# Patient Record
Sex: Female | Born: 1940 | Race: White | Hispanic: No | Smoking: Never smoker
Health system: Southern US, Community
[De-identification: ages and names within clinical notes are randomized; demographics above are authoritative.]

## PROBLEM LIST (undated history)

## (undated) DIAGNOSIS — F32A Depression, unspecified: Secondary | ICD-10-CM

## (undated) DIAGNOSIS — K219 Gastro-esophageal reflux disease without esophagitis: Secondary | ICD-10-CM

## (undated) DIAGNOSIS — F329 Major depressive disorder, single episode, unspecified: Secondary | ICD-10-CM

## (undated) DIAGNOSIS — E119 Type 2 diabetes mellitus without complications: Secondary | ICD-10-CM

## (undated) DIAGNOSIS — M549 Dorsalgia, unspecified: Secondary | ICD-10-CM

## (undated) DIAGNOSIS — E079 Disorder of thyroid, unspecified: Secondary | ICD-10-CM

## (undated) DIAGNOSIS — K5900 Constipation, unspecified: Secondary | ICD-10-CM

## (undated) DIAGNOSIS — F419 Anxiety disorder, unspecified: Secondary | ICD-10-CM

## (undated) HISTORY — DX: Gastro-esophageal reflux disease without esophagitis: K21.9

## (undated) HISTORY — DX: Type 2 diabetes mellitus without complications: E11.9

## (undated) HISTORY — PX: ABDOMINAL HYSTERECTOMY: SHX81

## (undated) HISTORY — DX: Dorsalgia, unspecified: M54.9

## (undated) HISTORY — DX: Constipation, unspecified: K59.00

## (undated) HISTORY — PX: OTHER SURGICAL HISTORY: SHX169

## (undated) HISTORY — DX: Disorder of thyroid, unspecified: E07.9

---

## 2007-06-28 ENCOUNTER — Ambulatory Visit (HOSPITAL_COMMUNITY): Payer: Self-pay | Admitting: Psychiatry

## 2007-08-16 ENCOUNTER — Ambulatory Visit (HOSPITAL_COMMUNITY): Payer: Self-pay | Admitting: Psychiatry

## 2007-08-28 ENCOUNTER — Ambulatory Visit (HOSPITAL_COMMUNITY): Payer: Self-pay | Admitting: Psychiatry

## 2007-09-11 ENCOUNTER — Ambulatory Visit (HOSPITAL_COMMUNITY): Payer: Self-pay | Admitting: Psychiatry

## 2010-10-19 ENCOUNTER — Ambulatory Visit (INDEPENDENT_AMBULATORY_CARE_PROVIDER_SITE_OTHER): Payer: Self-pay | Admitting: Internal Medicine

## 2014-02-13 ENCOUNTER — Ambulatory Visit (INDEPENDENT_AMBULATORY_CARE_PROVIDER_SITE_OTHER): Payer: Medicare Other | Admitting: Psychiatry

## 2014-02-13 ENCOUNTER — Encounter (HOSPITAL_COMMUNITY): Payer: Self-pay | Admitting: Psychiatry

## 2014-02-13 VITALS — BP 120/80 | Ht 64.0 in | Wt 175.0 lb

## 2014-02-13 DIAGNOSIS — F329 Major depressive disorder, single episode, unspecified: Secondary | ICD-10-CM

## 2014-02-13 DIAGNOSIS — F332 Major depressive disorder, recurrent severe without psychotic features: Secondary | ICD-10-CM

## 2014-02-13 MED ORDER — MIRTAZAPINE 30 MG PO TABS: 30.0000 mg | ORAL_TABLET | Freq: Every day | ORAL | Status: DC

## 2014-02-13 MED ORDER — MIRTAZAPINE 30 MG PO TABS
30.0000 mg | ORAL_TABLET | Freq: Every day | ORAL | Status: DC
Start: 2014-02-13 — End: 2014-02-13

## 2014-02-13 NOTE — Progress Notes (Signed)
Psychiatric Assessment Adult  Patient Identification:  Teresa Mccullough Date of Evaluation:  02/13/2014 Chief Complaint: I'm depressed and I don't have any energy History of Chief Complaint:   Chief Complaint  Patient presents with  . Anxiety  . Depression  . Establish Care    Anxiety Symptoms include nervous/anxious behavior.     this patient is a 73 year old widowed white female who lives alone in [place]. Her husband died in 02/04/2007. 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  The patient was referred by Dr[facility] Dimas [facility]as [facility] for treatment of depression.  The patient states that she's had anxiety since her children were born in her 72s. She developed depression in her 30s. She's never had suicidal ideation or psychiatric hospitalizations or psychotic symptoms. She's always been treated by her family physician. She's had ups and downs of her life but she's been particularly depressed since her husband died in 02-04-07 of colon cancer.  Her family Dr. as had her on a combination of Paxil 30 mg, Abilify 2 mg , and Xanax 1 mg up to 4 times a day. She was doing fairly well up until it 2 or 3 months ago. She was cutting the Abilify in half and it wasn't working so she increased it to 2 mg again. Still wasn't working and then her drugstore switched her to a generic brand. Even while going back to the brand name it has not helped. Currently she feels sad all the time and has crying spells. She feels jittery clammy and has hot flashes when she wakes up. She has dreams about her husband and misses him terribly. She takes an over-the-counter sleeping pill. She has no energy. She is very strong in her faith and spends time going to church and visiting friends and family but she has to really push herself. She has never been suicidal. She has lost her appetite and has lost 25 pounds since January. At times she has had high energy spells  at last for several days and she cleans incessantly. However at this point she is very depressed Review of Systems  Constitutional: Positive for activity change and unexpected weight change.  HENT: Negative.   Eyes: Negative.   Respiratory: Negative.   Cardiovascular: Negative.   Gastrointestinal: Positive for constipation.  Endocrine: Negative.   Genitourinary: Negative.   Musculoskeletal: Positive for back pain.  Allergic/Immunologic: Negative.   Neurological: Negative.   Hematological: Negative.   Psychiatric/Behavioral: Positive for dysphoric mood. The patient is nervous/anxious.    Physical Exam not done  Depressive Symptoms: depressed mood, anhedonia, psychomotor retardation, fatigue, difficulty concentrating, panic attacks,  (Hypo) Manic Symptoms:   Elevated Mood:  No Irritable Mood:  No Grandiosity:  No Distractibility:  No Labiality of Mood:  Yes Delusions:  No Hallucinations:  No Impulsivity:  No Sexually Inappropriate Behavior:  No Financial Extravagance:  No Flight of Ideas:  No  Anxiety Symptoms: Excessive Worry:  Yes Panic Symptoms:  Yes Agoraphobia:  No Obsessive Compulsive: No  Symptoms: None, Specific Phobias:  No Social Anxiety:  No  Psychotic Symptoms:  Hallucinations: No None Delusions:  No Paranoia:  No   Ideas of Reference:  No  PTSD Symptoms: Ever had a traumatic exposure:  No Had a traumatic exposure in the last month:  No Re-experiencing: No None Hypervigilance:  No Hyperarousal: No None Avoidance: No None  Traumatic Brain Injury: No   Past Psychiatric History: Diagnosis: Maj. depression  Hospitalizations: None   Outpatient Care: Has seen mental health providers but doesn't remember any names   Substance Abuse Care: none  Self-Mutilation: none  Suicidal Attempts: none  Violent Behaviors: none   Past Medical History:   Past Medical History  Diagnosis Date  . Thyroid disease   . Diabetes mellitus, type II   .  Constipation   . GERD (gastroesophageal reflux disease)   . Back pain    History of Loss of Consciousness:  No Seizure History:  No Cardiac History:  No Allergies:   Allergies  Allergen Reactions  . Cyclobenzaprine     hallucinations  . Ultram [Tramadol]     hallucinations   Current Medications:  Current Outpatient Prescriptions  Medication Sig Dispense Refill  . ALPRAZolam (XANAX) 1 MG tablet Take 1 mg by mouth 4 (four) times daily.      . ARIPiprazole (ABILIFY) 2 MG tablet Take 2 mg by mouth daily.      Marland Kitchen. levothyroxine (SYNTHROID, LEVOTHROID) 75 MCG tablet Take 75 mcg by mouth daily before breakfast.      . naproxen (NAPROSYN) 500 MG tablet Take 500 mg by mouth 2 (two) times daily with a meal.      . PARoxetine (PAXIL) 30 MG tablet Take 30 mg by mouth daily.      . ranitidine (ZANTAC) 150 MG capsule Take 150 mg by mouth every evening.      . mirtazapine (REMERON) 30 MG tablet Take 1 tablet (30 mg total) by mouth at bedtime.  30 tablet  2   No current facility-administered medications for this visit.    Previous Psychotropic Medications:  Medication Dose                          Substance Abuse History in the last 12 months: Substance Age of 1st Use Last Use Amount Specific Type  Nicotine      Alcohol      Cannabis      Opiates      Cocaine      Methamphetamines      LSD      Ecstasy      Benzodiazepines      Caffeine      Inhalants      Others:                          Medical Consequences of Substance Abuse: n/a  Legal Consequences of Substance Abuse: n/a  Family Consequences of Substance Abuse: n/a  Blackouts:  No DT's:  No Withdrawal Symptoms:  No None  Social History: Current Place of Residence: [place] Place of Birth: [place] Family Members: 3 sisters, several grandchildren and great-grandchildren, 3 children Marital Status:  Widowed Children: 3    Education:  GED Educational Problems/Performance:   Religious Beliefs/Practices: Christian History of Abuse: none Armed forces technical officerccupational Experiences; housewife Hotel managerMilitary History:  None. Legal History: none Hobbies/Interests: Visiting family, cleaning  Family History:   Family History  Problem Relation Age of Onset  . Depression Sister   . Alcohol abuse Brother   . Alcohol abuse Son   . Alcohol abuse Father     Mental Status Examination/Evaluation: Objective:  Appearance: Casual, Neat and Well Groomed  Eye Contact::  Good  Speech:  Slow  Volume:  Decreased  Mood:  Depressed and blunted   Affect:  Constricted, Depressed and Flat  Thought Process:  Circumstantial  Orientation:  Full (  Time, Place, and Person)  Thought Content:  Rumination  Suicidal Thoughts:  No  Homicidal Thoughts:  No  Judgement:  Fair  Insight:  Fair  Psychomotor Activity:  Decreased  Akathisia:  No  Handed:  Right  AIMS (if indicated):    Assets:  Communication Skills Desire for Improvement Social Support    Laboratory/X-Ray Psychological Evaluation(s)        Assessment:  Axis I: Major Depression, Recurrent severe  AXIS I Major Depression, Recurrent severe  AXIS II Deferred  AXIS III Past Medical History  Diagnosis Date  . Thyroid disease   . Diabetes mellitus, type II   . Constipation   . GERD (gastroesophageal reflux disease)   . Back pain      AXIS IV other psychosocial or environmental problems  AXIS V 41-50 serious symptoms   Treatment Plan/Recommendations:  Plan of Care: Medication management   Laboratory:  Psychotherapy: She declines counseling due to the cost   Medications: She'll start mirtazapine 30 mg each bedtime to help with sleep and depression. She'll cut Paxil down to 15 mg every morning. She doesn't think Abilify is helping so she will discontinue it. She'll continue Xanax 1 mg up to 4 times a day   Routine PRN Medications:  No  Consultations:   Safety Concerns:  She denies thoughts of self-harm   Other: She will return in 4  weeks     Diannia Ruder, MD 6/11/20152:48 PM

## 2014-02-27 ENCOUNTER — Ambulatory Visit (HOSPITAL_COMMUNITY): Payer: Self-pay | Admitting: Psychiatry

## 2014-03-13 ENCOUNTER — Ambulatory Visit (HOSPITAL_COMMUNITY): Payer: Self-pay | Admitting: Psychiatry

## 2014-03-18 ENCOUNTER — Telehealth (HOSPITAL_COMMUNITY): Payer: Self-pay | Admitting: *Deleted

## 2014-03-18 NOTE — Telephone Encounter (Signed)
She got a shot of phenergan and is feeling better

## 2014-03-19 ENCOUNTER — Telehealth (HOSPITAL_COMMUNITY): Payer: Self-pay | Admitting: *Deleted

## 2014-03-19 NOTE — Telephone Encounter (Signed)
noted 

## 2014-03-21 ENCOUNTER — Ambulatory Visit (INDEPENDENT_AMBULATORY_CARE_PROVIDER_SITE_OTHER): Payer: Medicare Other | Admitting: Psychiatry

## 2014-03-21 ENCOUNTER — Telehealth (HOSPITAL_COMMUNITY): Payer: Self-pay | Admitting: *Deleted

## 2014-03-21 ENCOUNTER — Encounter (HOSPITAL_COMMUNITY): Payer: Self-pay | Admitting: Psychiatry

## 2014-03-21 VITALS — BP 130/80 | Ht 64.0 in | Wt 182.0 lb

## 2014-03-21 DIAGNOSIS — F411 Generalized anxiety disorder: Secondary | ICD-10-CM

## 2014-03-21 MED ORDER — QUETIAPINE FUMARATE 50 MG PO TABS: 50.0000 mg | ORAL_TABLET | Freq: Every day | ORAL | Status: DC

## 2014-03-21 NOTE — Telephone Encounter (Signed)
Told to take xanax 4x daily and set up appt sooner

## 2014-03-21 NOTE — Progress Notes (Signed)
Patient ID: Teresa GelinasLucille J Codispoti, female   DOB: 12/02/1940, 73 y.o.   MRN: [id number]  Psychiatric Assessment Adult  Patient Identification:  Teresa Mccullough Date of Evaluation:  03/21/2014 Chief Complaint: I'm really nervous History of Chief Complaint:   Chief Complaint  Patient presents with  . Anxiety  . Depression  . Follow-up    Anxiety Symptoms include nervous/anxious behavior.     this patient is a 73 year old widowed white female who lives alone in [place]. Her husband died in 2008. 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  The patient was referred by Dr. Dimas [facility]asHoward of [facility] for treatment of depression.  The patient states that she's had anxiety since her children were born in her 6220s. She developed depression in her 30s. She's never had suicidal ideation or psychiatric hospitalizations or psychotic symptoms. She's always been treated by her family physician. She's had ups and downs of her life but she's been particularly depressed since her husband died in 2008 of colon cancer.  Her family Dr. as had her on a combination of Paxil 30 mg, Abilify 2 mg , and Xanax 1 mg up to 4 times a day. She was doing fairly well up until it 2 or 3 months ago. She was cutting the Abilify in half and it wasn't working so she increased it to 2 mg again. Still wasn't working and then her drugstore switched her to a generic brand. Even while going back to the brand name it has not helped. Currently she feels sad all the time and has crying spells. She feels jittery clammy and has hot flashes when she wakes up. She has dreams about her husband and misses him terribly. She takes an over-the-counter sleeping pill. She has no energy. She is very strong in her faith and spends time going to church and visiting friends and family but she has to really push herself. She has never been suicidal. She has lost her appetite and has lost 25 pounds  since January. At times she has had high energy spells at last for several days and she cleans incessantly. However at this point she is very depressed  The patient returns after 3 weeks with her granddaughter. She states that she has been feeling bad all week and ended up getting [facility] with severe acid reflux and only stayed 1 day. She can't stop shaking during the day. The mirtazapine as helped her sleep but she shakes all day even when taking Xanax. Abilify did well for while but she stopped it because she couldn't afford it and the generic brand didn't work as well for her. I told her perhaps we could try a similar medicine such as Seroquel. Her granddaughter states that the patient has "been like this all her life." She gets anxious and nervous developed stomach problems which makes her more anxious   Review of Systems  Constitutional: Positive for activity change and unexpected weight change.  HENT: Negative.   Eyes: Negative.   Respiratory: Negative.   Cardiovascular: Negative.   Gastrointestinal: Positive for constipation.  Endocrine: Negative.   Genitourinary: Negative.   Musculoskeletal: Positive for back pain.  Allergic/Immunologic: Negative.   Neurological: Negative.   Hematological: Negative.   Psychiatric/Behavioral: Positive for dysphoric mood. The patient is nervous/anxious.    Physical Exam not done  Depressive Symptoms: depressed mood, anhedonia, psychomotor retardation, fatigue, difficulty concentrating, panic attacks,  (Hypo) Manic Symptoms:   Elevated  Mood:  No Irritable Mood:  No Grandiosity:  No Distractibility:  No Labiality of Mood:  Yes Delusions:  No Hallucinations:  No Impulsivity:  No Sexually Inappropriate Behavior:  No Financial Extravagance:  No Flight of Ideas:  No  Anxiety Symptoms: Excessive Worry:  Yes Panic Symptoms:  Yes Agoraphobia:  No Obsessive Compulsive: No  Symptoms: None, Specific Phobias:  No Social  Anxiety:  No  Psychotic Symptoms:  Hallucinations: No None Delusions:  No Paranoia:  No   Ideas of Reference:  No  PTSD Symptoms: Ever had a traumatic exposure:  No Had a traumatic exposure in the last month:  No Re-experiencing: No None Hypervigilance:  No Hyperarousal: No None Avoidance: No None  Traumatic Brain Injury: No   Past Psychiatric History: Diagnosis: Maj. depression   Hospitalizations: None   Outpatient Care: Has seen mental health providers but doesn't remember any names   Substance Abuse Care: none  Self-Mutilation: none  Suicidal Attempts: none  Violent Behaviors: none   Past Medical History:   Past Medical History  Diagnosis Date  . Thyroid disease   . Diabetes mellitus, type II   . Constipation   . GERD (gastroesophageal reflux disease)   . Back pain    History of Loss of Consciousness:  No Seizure History:  No Cardiac History:  No Allergies:   Allergies  Allergen Reactions  . Cyclobenzaprine     hallucinations  . Ultram [Tramadol]     hallucinations   Current Medications:  Current Outpatient Prescriptions  Medication Sig Dispense Refill  . omeprazole (PRILOSEC) 20 MG capsule Take 20 mg by mouth daily.      . pantoprazole (PROTONIX) 40 MG tablet Take 40 mg by mouth daily.      . promethazine (PHENERGAN) 25 MG tablet Take 25 mg by mouth every 6 (six) hours as needed for nausea or vomiting.      Marland Kitchen ALPRAZolam (XANAX) 1 MG tablet Take 1 mg by mouth 4 (four) times daily.      Marland Kitchen levothyroxine (SYNTHROID, LEVOTHROID) 75 MCG tablet Take 75 mcg by mouth daily before breakfast.      . naproxen (NAPROSYN) 500 MG tablet Take 500 mg by mouth 2 (two) times daily with a meal.      . PARoxetine (PAXIL) 30 MG tablet Take 30 mg by mouth daily.      . QUEtiapine (SEROQUEL) 50 MG tablet Take 1 tablet (50 mg total) by mouth at bedtime.  30 tablet  2  . ranitidine (ZANTAC) 150 MG capsule Take 150 mg by mouth every evening.       No current facility-administered  medications for this visit.    Previous Psychotropic Medications:  Medication Dose                          Substance Abuse History in the last 12 months: Substance Age of 1st Use Last Use Amount Specific Type  Nicotine      Alcohol      Cannabis      Opiates      Cocaine      Methamphetamines      LSD      Ecstasy      Benzodiazepines      Caffeine      Inhalants      Others:  Medical Consequences of Substance Abuse: n/a  Legal Consequences of Substance Abuse: n/a  Family Consequences of Substance Abuse: n/a  Blackouts:  No DT's:  No Withdrawal Symptoms:  No None  Social History: Current Place of Residence: [place] of Birth: [place] Family Members: 3 sisters, several grandchildren and great-grandchildren, 3 children Marital Status:  Widowed Children: 3    Education:  GED Educational Problems/Performance:  Religious Beliefs/Practices: Christian History of Abuse: none Armed forces technical officer; housewife Hotel manager History:  None. Legal History: none Hobbies/Interests: Visiting family, cleaning  Family History:   Family History  Problem Relation Age of Onset  . Depression Sister   . Alcohol abuse Brother   . Alcohol abuse Son   . Alcohol abuse Father     Mental Status Examination/Evaluation: Objective:  Appearance: Casual, Neat and Well Groomed  Eye Contact::  Good  Speech:  Slow  Volume:  Decreased  Mood:  Very anxious   Affect:  Constricted, Depressed and Flat  Thought Process:  Circumstantial  Orientation:  Full (Time, Place, and Person)  Thought Content:  Rumination  Suicidal Thoughts:  No  Homicidal Thoughts:  No  Judgement:  Fair  Insight:  Fair  Psychomotor Activity:  Decreased  Akathisia:  No  Handed:  Right  AIMS (if indicated):    Assets:  Communication Skills Desire for Improvement Social Support    Laboratory/X-Ray Psychological Evaluation(s)        Assessment:   Axis I: Major Depression, Recurrent severe  AXIS I Major Depression, Recurrent severe  AXIS II Deferred  AXIS III Past Medical History  Diagnosis Date  . Thyroid disease   . Diabetes mellitus, type II   . Constipation   . GERD (gastroesophageal reflux disease)   . Back pain      AXIS IV other psychosocial or environmental problems  AXIS V 41-50 serious symptoms   Treatment Plan/Recommendations:  Plan of Care: Medication management   Laboratory:  Psychotherapy: She declines counseling due to the cost   Medications: She'll discontinue mirtazapine and start Seroquel 50 mg each bedtime. She'll continue Xanax 1 mg 4 times a day and Paxil 30 mg every morning   Routine PRN Medications:  No  Consultations:   Safety Concerns:  She denies thoughts of self-harm   Other: She will return in 4 weeks     Diannia Ruder, MD 7/17/20154:19 PM

## 2014-03-23 ENCOUNTER — Emergency Department (HOSPITAL_COMMUNITY): Payer: Medicare Other

## 2014-03-23 ENCOUNTER — Emergency Department (HOSPITAL_COMMUNITY)
Admission: EM | Admit: 2014-03-23 | Discharge: 2014-03-24 | Disposition: A | Discharge status: Home / Self Care | Payer: Medicare Other | Attending: Emergency Medicine | Admitting: Emergency Medicine

## 2014-03-23 ENCOUNTER — Encounter (HOSPITAL_COMMUNITY): Payer: Self-pay | Admitting: Emergency Medicine

## 2014-03-23 DIAGNOSIS — R42 Dizziness and giddiness: Secondary | ICD-10-CM | POA: Insufficient documentation

## 2014-03-23 DIAGNOSIS — K219 Gastro-esophageal reflux disease without esophagitis: Secondary | ICD-10-CM | POA: Insufficient documentation

## 2014-03-23 DIAGNOSIS — E039 Hypothyroidism, unspecified: Secondary | ICD-10-CM | POA: Insufficient documentation

## 2014-03-23 DIAGNOSIS — F329 Major depressive disorder, single episode, unspecified: Secondary | ICD-10-CM | POA: Insufficient documentation

## 2014-03-23 DIAGNOSIS — R110 Nausea: Secondary | ICD-10-CM | POA: Diagnosis not present

## 2014-03-23 DIAGNOSIS — E119 Type 2 diabetes mellitus without complications: Secondary | ICD-10-CM | POA: Insufficient documentation

## 2014-03-23 DIAGNOSIS — Z79899 Other long term (current) drug therapy: Secondary | ICD-10-CM | POA: Diagnosis not present

## 2014-03-23 DIAGNOSIS — R109 Unspecified abdominal pain: Secondary | ICD-10-CM | POA: Insufficient documentation

## 2014-03-23 DIAGNOSIS — F419 Anxiety disorder, unspecified: Secondary | ICD-10-CM

## 2014-03-23 DIAGNOSIS — F3289 Other specified depressive episodes: Secondary | ICD-10-CM | POA: Diagnosis not present

## 2014-03-23 DIAGNOSIS — F411 Generalized anxiety disorder: Secondary | ICD-10-CM | POA: Diagnosis present

## 2014-03-23 HISTORY — DX: Anxiety disorder, unspecified: F41.9

## 2014-03-23 HISTORY — DX: Depression, unspecified: F32.A

## 2014-03-23 HISTORY — DX: Major depressive disorder, single episode, unspecified: F32.9

## 2014-03-23 LAB — COMPREHENSIVE METABOLIC PANEL
ALBUMIN: 3.7 g/dL (ref 3.5–5.2)
ALT: 17 U/L (ref 0–35)
ANION GAP: 18 — AB (ref 5–15)
AST: 18 U/L (ref 0–37)
Alkaline Phosphatase: 93 U/L (ref 39–117)
BUN: 13 mg/dL (ref 6–23)
CALCIUM: 9.6 mg/dL (ref 8.4–10.5)
CHLORIDE: 98 meq/L (ref 96–112)
CO2: 24 mEq/L (ref 19–32)
CREATININE: 0.89 mg/dL (ref 0.50–1.10)
GFR calc Af Amer: 72 mL/min — ABNORMAL LOW (ref >90)
GFR calc non Af Amer: 62 mL/min — ABNORMAL LOW (ref >90)
Glucose, Bld: 118 mg/dL — ABNORMAL HIGH (ref 70–99)
Potassium: 4.4 mEq/L (ref 3.7–5.3)
Sodium: 140 mEq/L (ref 137–147)
Total Bilirubin: 0.3 mg/dL (ref 0.3–1.2)
Total Protein: 7 g/dL (ref 6.0–8.3)

## 2014-03-23 LAB — RAPID URINE DRUG SCREEN, HOSP PERFORMED
Amphetamines: NOT DETECTED
Barbiturates: NOT DETECTED
Benzodiazepines: POSITIVE — AB
Cocaine: NOT DETECTED
Opiates: NOT DETECTED
Tetrahydrocannabinol: NOT DETECTED

## 2014-03-23 LAB — CBC
HEMATOCRIT: 41.4 % (ref 36.0–46.0)
Hemoglobin: 13.7 g/dL (ref 12.0–15.0)
MCH: 30.9 pg (ref 26.0–34.0)
MCHC: 33.1 g/dL (ref 30.0–36.0)
MCV: 93.5 fL (ref 78.0–100.0)
PLATELETS: 295 10*3/uL (ref 150–400)
RBC: 4.43 MIL/uL (ref 3.87–5.11)
RDW: 14.1 % (ref 11.5–15.5)
WBC: 9.2 10*3/uL (ref 4.0–10.5)

## 2014-03-23 LAB — ETHANOL

## 2014-03-23 LAB — SALICYLATE LEVEL

## 2014-03-23 LAB — ACETAMINOPHEN LEVEL: Acetaminophen (Tylenol), Serum: <15 ug/mL (ref 10–30)

## 2014-03-23 LAB — TROPONIN I: Troponin I: <0.3 ng/mL

## 2014-03-23 MED ORDER — ACETAMINOPHEN 325 MG PO TABS
650.0000 mg | ORAL_TABLET | ORAL | Status: DC | PRN
Start: 2014-03-23 — End: 2014-03-24

## 2014-03-23 MED ORDER — ONDANSETRON HCL 4 MG/2ML IJ SOLN
4.0000 mg | Freq: Once | INTRAMUSCULAR | Status: AC
  Administered 2014-03-23: 4 mg via INTRAVENOUS
  Filled 2014-03-23: qty 2

## 2014-03-23 MED ORDER — PAROXETINE HCL 30 MG PO TABS: 30.0000 mg | ORAL_TABLET | Freq: Every day | ORAL | Status: DC

## 2014-03-23 MED ORDER — ZOLPIDEM TARTRATE 5 MG PO TABS: 5.0000 mg | ORAL_TABLET | Freq: Every evening | ORAL | Status: DC | PRN

## 2014-03-23 MED ORDER — PROMETHAZINE HCL 25 MG PO TABS
25.0000 mg | ORAL_TABLET | Freq: Four times a day (QID) | ORAL | Status: DC | PRN
  Filled 2014-03-23: qty 1

## 2014-03-23 MED ORDER — ONDANSETRON HCL 4 MG PO TABS: 4.0000 mg | ORAL_TABLET | Freq: Three times a day (TID) | ORAL | Status: DC | PRN

## 2014-03-23 MED ORDER — ALUM & MAG HYDROXIDE-SIMETH 200-200-20 MG/5ML PO SUSP: 30.0000 mL | ORAL | Status: DC | PRN

## 2014-03-23 MED ORDER — FAMOTIDINE 20 MG PO TABS
20.0000 mg | ORAL_TABLET | Freq: Every day | ORAL | Status: DC
  Administered 2014-03-23: 20 mg via ORAL
  Filled 2014-03-23: qty 1

## 2014-03-23 MED ORDER — QUETIAPINE FUMARATE 25 MG PO TABS
50.0000 mg | ORAL_TABLET | Freq: Every day | ORAL | Status: DC
  Administered 2014-03-23: 50 mg via ORAL
  Filled 2014-03-23: qty 2

## 2014-03-23 MED ORDER — IBUPROFEN 400 MG PO TABS: 600.0000 mg | ORAL_TABLET | Freq: Three times a day (TID) | ORAL | Status: DC | PRN

## 2014-03-23 MED ORDER — PANTOPRAZOLE SODIUM 40 MG PO TBEC
40.0000 mg | DELAYED_RELEASE_TABLET | Freq: Every day | ORAL | Status: DC
  Administered 2014-03-23: 40 mg via ORAL
  Filled 2014-03-23: qty 1

## 2014-03-23 MED ORDER — METFORMIN HCL ER 500 MG PO TB24
500.0000 mg | ORAL_TABLET | Freq: Every day | ORAL | Status: DC
  Filled 2014-03-23: qty 1

## 2014-03-23 MED ORDER — SODIUM CHLORIDE 0.9 % IV BOLUS (SEPSIS)
1000.0000 mL | Freq: Once | INTRAVENOUS | Status: AC
  Administered 2014-03-23: 1000 mL via INTRAVENOUS

## 2014-03-23 MED ORDER — ALPRAZOLAM 0.5 MG PO TABS
1.0000 mg | ORAL_TABLET | Freq: Four times a day (QID) | ORAL | Status: DC
  Administered 2014-03-23: 1 mg via ORAL
  Filled 2014-03-23: qty 2

## 2014-03-23 MED ORDER — LEVOTHYROXINE SODIUM 75 MCG PO TABS
75.0000 ug | ORAL_TABLET | Freq: Every day | ORAL | Status: DC
  Filled 2014-03-23: qty 1

## 2014-03-23 MED ORDER — NICOTINE 21 MG/24HR TD PT24
21.0000 mg | MEDICATED_PATCH | Freq: Every day | TRANSDERMAL | Status: DC
  Filled 2014-03-23: qty 1

## 2014-03-23 MED ORDER — MIRTAZAPINE 30 MG PO TABS
30.0000 mg | ORAL_TABLET | Freq: Every day | ORAL | Status: DC
  Administered 2014-03-23: 30 mg via ORAL
  Filled 2014-03-23: qty 1

## 2014-03-23 NOTE — ED Notes (Addendum)
Pt belongings sent home with daughter in law.

## 2014-03-23 NOTE — ED Notes (Signed)
TTS set up at bedside. 

## 2014-03-23 NOTE — [facility] Counselor (Signed)
Teresa Peliffany Greene, PA-C reports was medically cleared about 25 minutes ago. Pt presents with anxiety, depression, and chest pain so heat condition needed to be ruled out. Pt is concerned that maybe her medication is not working.   TA to begin immediately.   Teresa BernhardtNancy Milagros Mccullough, [facility] Triage Specialist 03/23/2014 10:15 PM

## 2014-03-23 NOTE — ED Provider Notes (Signed)
CSN: [id number]     Arrival date & time 03/23/14  1631 History   First MD Initiated Contact with Patient 03/23/14 1849     Chief Complaint  Patient presents with  . Anxiety     (Consider location/radiation/quality/duration/timing/severity/associated sxs/prior Treatment) HPI 73 year old female presents with worsening depression and anxiety. Over the last several weeks she's had multiple medication changes. She was taken off of her Abilify because it was too expensive. She's been put on mirtazapine and later taken off of that. She's now on Seroquel over the last couple days. She feels that her depression and anxiety are still worsening she is unable to get the symptoms under control. She is presenting wanting to be admitted for medication adjustment and treatment of her depression and anxiety. She's not feel suicidal or homicidal. Patient also endorses several other symptoms such as chronic nausea, lightheadedness over the last several weeks. She's also had abdominal and chest pain. She had these evaluated when she went to an outside hospital including a CAT scan. Patient denies any headaches. She is a poor historian and is difficult to tell what makes all her symptoms better or worse.  Past Medical History  Diagnosis Date  . Thyroid disease   . Diabetes mellitus, type II   . Constipation   . GERD (gastroesophageal reflux disease)   . Back pain   . Depression   . Anxiety    Past Surgical History  Procedure Laterality Date  . Abdominal hysterectomy    . Ulcer surgery    . Goiter removed     Family History  Problem Relation Age of Onset  . Depression Sister   . Alcohol abuse Brother   . Alcohol abuse Son   . Alcohol abuse Father    History  Substance Use Topics  . Smoking status: Never Smoker   . Smokeless tobacco: Not on file  . Alcohol Use: No   OB History   Grav Para Term Preterm Abortions TAB SAB Ect Mult Living                 Review of Systems  Constitutional:  Negative for fever.  Eyes: Negative for visual disturbance.  Respiratory: Negative for shortness of breath.   Cardiovascular: Negative for chest pain.  Gastrointestinal: Positive for nausea and abdominal pain. Negative for vomiting.  Neurological: Positive for dizziness and light-headedness. Negative for syncope, weakness, numbness and headaches.  Psychiatric/Behavioral: Positive for dysphoric mood. Negative for suicidal ideas. The patient is nervous/anxious.   All other systems reviewed and are negative.     Allergies  Cyclobenzaprine and Ultram  Home Medications   Prior to Admission medications   Medication Sig Start Date End Date Taking? Authorizing Provider  ALPRAZolam Prudy Feeler) 1 MG tablet Take 1 mg by mouth 4 (four) times daily.    Historical Provider, MD  levothyroxine (SYNTHROID, LEVOTHROID) 75 MCG tablet Take 75 mcg by mouth daily before breakfast.    Historical Provider, MD  naproxen (NAPROSYN) 500 MG tablet Take 500 mg by mouth 2 (two) times daily with a meal.    Historical Provider, MD  omeprazole (PRILOSEC) 20 MG capsule Take 20 mg by mouth daily.    Historical Provider, MD  pantoprazole (PROTONIX) 40 MG tablet Take 40 mg by mouth daily.    Historical Provider, MD  PARoxetine (PAXIL) 30 MG tablet Take 30 mg by mouth daily.    Historical Provider, MD  promethazine (PHENERGAN) 25 MG tablet Take 25 mg by mouth every 6 (six) hours  as needed for nausea or vomiting.    Historical Provider, MD  QUEtiapine (SEROQUEL) 50 MG tablet Take 1 tablet (50 mg total) by mouth at bedtime. 03/21/14 03/21/15  Diannia Rudereborah Ross, MD  ranitidine (ZANTAC) 150 MG capsule Take 150 mg by mouth every evening.    Historical Provider, MD   BP 134/96  Pulse 70  Temp(Src) 98.6 F (37 C) (Oral)  Resp 20  Ht 5\' 4"  (1.626 m)  Wt 184 lb (83.462 kg)  BMI 31.57 kg/m2  SpO2 93% Physical Exam  Nursing note and vitals reviewed. Constitutional: She is oriented to person, place, and time. She appears well-developed  and well-nourished.  HENT:  Head: Normocephalic and atraumatic.  Right Ear: External ear normal.  Left Ear: External ear normal.  Nose: Nose normal.  Eyes: EOM are normal. Pupils are equal, round, and reactive to light. Right eye exhibits no discharge. Left eye exhibits no discharge.  Cardiovascular: Normal rate, regular rhythm and normal heart sounds.   Pulmonary/Chest: Effort normal and breath sounds normal.  Abdominal: Soft. There is no tenderness.  Neurological: She is alert and oriented to person, place, and time.  CN 2-12 grossly intact. 5/5 strength in all 4 extremities. Normal finger to nose  Skin: Skin is warm and dry.  Psychiatric:  Flat affect    ED Course  Procedures (including critical care time) Labs Review Labs Reviewed  COMPREHENSIVE METABOLIC PANEL - Abnormal; Notable for the following:    Glucose, Bld 118 (*)    GFR calc non Af Amer 62 (*)    GFR calc Af Amer 72 (*)    Anion gap 18 (*)    All other components within normal limits  SALICYLATE LEVEL - Abnormal; Notable for the following:    Salicylate Lvl <2.0 (*)    All other components within normal limits  ACETAMINOPHEN LEVEL  CBC  ETHANOL  URINE RAPID DRUG SCREEN (HOSP PERFORMED)  TROPONIN I    Imaging Review Ct Head Wo Contrast  03/23/2014   CLINICAL DATA:  Depression with dizziness and nausea.  EXAM: CT HEAD WITHOUT CONTRAST  TECHNIQUE: Contiguous axial images were obtained from the base of the skull through the vertex without intravenous contrast.  COMPARISON:  None.  FINDINGS: There is no evidence of acute intracranial hemorrhage, mass lesion, brain edema or extra-axial fluid collection. The ventricles and subarachnoid spaces are appropriately sized for age. There is mild asymmetric atrophy along the left sylvian fissure. There old infarcts within the basal ganglia bilaterally and the left corpus callosum anteriorly. There is no CT evidence of acute cortical infarction.  The visualized paranasal sinuses,  mastoid air cells and middle ears are clear. There is an nonaggressive lucent lesion in the right parietal bone on image 27. No acute osseous findings seen.  IMPRESSION: 1. No acute intracranial findings. 2. Old small vessel infarcts. 3. Nonaggressive right parietal calvarial lesion, probably a hemangioma.   Electronically Signed   By: Roxy HorsemanBill  Veazey M.D.   On: 03/23/2014 19:51     EKG Interpretation   Date/Time:  Sunday March 23 2014 18:56:36 EDT Ventricular Rate:  70 PR Interval:  154 QRS Duration: 91 QT Interval:  401 QTC Calculation: 433 R Axis:   -18 Text Interpretation:  Sinus rhythm Borderline left axis deviation Abnormal  R-wave progression, early transition No old tracing to compare Confirmed  by Briann Sarchet  MD, Anamaria Dusenbury ([id number]) on 03/23/2014 7:55:07 PM      MDM   Final diagnoses:  None    Patient  with primary complaint of depression and anxiety, poorly controlled on multiple psych meds and regimens. No SI/HI. Also with multiple other complaints. Patient is a poor historian but these complaints appear to have been going on for at least several weeks. Due to this I have low suspicion for ACS, cardiac arrhythmia, PE, etc as cause of her lightheadedness and nausea. Nausea appears chronic. Screening labs and EKG obtained. Will consult TTS when medical workup complete. Care transferred with TTS pending.     Audree Camel, MD 03/23/14 2107

## 2014-03-23 NOTE — ED Notes (Signed)
She states her doctor changed her depression medication Friday and since then shes felt "depressed, panicky and my nerves are getting me." denies SI, HI.

## 2014-03-23 NOTE — ED Notes (Signed)
[place] sandwich/Coke given per pt.'s request.

## 2014-03-23 NOTE — ED Provider Notes (Signed)
The patient was seen by Dr. Criss Alvine for anxiety, she also had some complaints of chest pains. She was handed off to myself at the end of shift to follow-up on a Troponin. If the Troponin is negative the patient would like to be treated for her anxiety and have her medications adjusted.   Results for orders placed during the hospital encounter of 03/23/14  ACETAMINOPHEN LEVEL      Result Value Ref Range   Acetaminophen (Tylenol), Serum <15.0  10 - 30 ug/mL  CBC      Result Value Ref Range   WBC 9.2  4.0 - 10.5 K/uL   RBC 4.43  3.87 - 5.11 MIL/uL   Hemoglobin 13.7  12.0 - 15.0 g/dL   HCT 16.1  09.6 - 04.5 %   MCV 93.5  78.0 - 100.0 fL   MCH 30.9  26.0 - 34.0 pg   MCHC 33.1  30.0 - 36.0 g/dL   RDW 40.9  81.1 - 91.4 %   Platelets 295  150 - 400 K/uL  COMPREHENSIVE METABOLIC PANEL      Result Value Ref Range   Sodium 140  137 - 147 mEq/L   Potassium 4.4  3.7 - 5.3 mEq/L   Chloride 98  96 - 112 mEq/L   CO2 24  19 - 32 mEq/L   Glucose, Bld 118 (*) 70 - 99 mg/dL   BUN 13  6 - 23 mg/dL   Creatinine, Ser 7.82  0.50 - 1.10 mg/dL   Calcium 9.6  8.4 - 95.6 mg/dL   Total Protein 7.0  6.0 - 8.3 g/dL   Albumin 3.7  3.5 - 5.2 g/dL   AST 18  0 - 37 U/L   ALT 17  0 - 35 U/L   Alkaline Phosphatase 93  39 - 117 U/L   Total Bilirubin 0.3  0.3 - 1.2 mg/dL   GFR calc non Af Amer 62 (*) >90 mL/min   GFR calc Af Amer 72 (*) >90 mL/min   Anion gap 18 (*) 5 - 15  ETHANOL      Result Value Ref Range   Alcohol, Ethyl (B) <11  0 - 11 mg/dL  SALICYLATE LEVEL      Result Value Ref Range   Salicylate Lvl <2.0 (*) 2.8 - 20.0 mg/dL  TROPONIN I      Result Value Ref Range   Troponin I <0.30  <0.30 ng/mL   Ct Head Wo Contrast  03/23/2014   CLINICAL DATA:  Depression with dizziness and nausea.  EXAM: CT HEAD WITHOUT CONTRAST  TECHNIQUE: Contiguous axial images were obtained from the base of the skull through the vertex without intravenous contrast.  COMPARISON:  None.  FINDINGS: There is no evidence of  acute intracranial hemorrhage, mass lesion, brain edema or extra-axial fluid collection. The ventricles and subarachnoid spaces are appropriately sized for age. There is mild asymmetric atrophy along the left sylvian fissure. There old infarcts within the basal ganglia bilaterally and the left corpus callosum anteriorly. There is no CT evidence of acute cortical infarction.  The visualized paranasal sinuses, mastoid air cells and middle ears are clear. There is an nonaggressive lucent lesion in the right parietal bone on image 27. No acute osseous findings seen.  IMPRESSION: 1. No acute intracranial findings. 2. Old small vessel infarcts. 3. Nonaggressive right parietal calvarial lesion, probably a hemangioma.   Electronically Signed   By: Roxy Horseman M.D.   On: 03/23/2014 19:51    9:09  pm The patients Troponin is negative. Will placed psych holding orders and order a TTS consult. Labs reviewed, home medications ordered. Move to Pod C.   Filed Vitals:   03/23/14 2022  BP:   Pulse: 69  Temp:   Resp: [address]16     Kyi Romanello G Bentlie Catanzaro, PA-C 03/23/14 2120

## 2014-03-23 NOTE — BHH Counselor (Signed)
Unable to reach anyone at Pod[facility]C to request tele assessment equipment be set up.   Contacted EDP to obtain Pt information and learn if she is medically clear. Provided with information to contact Marlon Peliffany Greene, PA-C who is currently working with Pt. No answer at this time.   Will attempt to contact Pod -C and Marlon Peliffany Greene, PA- C again in 5 minutes. Will begin TA once contacts are made.   2212 Brandi, RN reports Pt is requesting to sleep, but is willing to do assessment now.  Review notes from Dr. Criss AlvineGoldston, and Marlon Peliffany Greene, PA-C.  TA to commence shortly.   Clista BernhardtNancy Deyonna Fitzsimmons, [facility] Triage Specialist 03/23/2014 10:09 PM

## 2014-03-23 NOTE — BH Assessment (Signed)
Tele Assessment Note   Teresa Mccullough is an 73 y.o. female presenting to ED due to worsening symptoms of depression and anxiety. Pt reports she has a long-standing history of depression and anxiety and has been treated by her PCP Dr. Dimas AguasHoward. Pt sts she was recently referred to psychiatrist Dr. Tenny Crawoss due to worsening symptoms. Pt had medication changed on Friday, and did not feel relief. "My anxiety and depression were tore to pieces." Pt denies SI/HI. Denies SA, and psychosis. No history of self-injury. Pt sts she wants to be placed on a medication that will help her depression. Pt sts her new medication Seroquel helps her sleep, but noted "When I wake up it is the same old same old depression."  Pt indicated she has had anxiety since her [age]'s after the birth of her children and depression beginning in her 30s. Pt sts her symptoms worsened recently around the 4th of July. Pt indicated she thinks this might be due to eating a bunch of spicy food. Pt noted that when she has physically discomfort such as stomach aches, or numbness in hands or feat her anxiety increases. Pt noted she has fearful feelings, "like someone just scared you, but no one did." She reports she feels fearful. Pt reports the last few months her depressive symptoms have worsened. She sts she is tearful often, fatigued, and has been isolating. Pt sts "I just can't stand to be alone." Pt indicated he husband died in 2008 and recently she has been dreaming about him. "I just miss him something terrible." Pt reports prior to starting new medication she had lost 2 -3 pounds. Pt noted despite depressive symptoms she gets up everyday, showers, and cleans her clothes. She visits with her sister and attends church 3 times per week. Pt credits her faith in Jesus with helping her never have suicidal thoughts.   Pt indicated [facility] and this helped her feel somewhat better. As her symptoms have increased she has stopped doing  these types of activities. "I think I have gotten lazy, I just watch television, and isolate myself." Pt sts she would be willing to try to seek out activities outside of the home again to decrease feelings of isolation.   Pt family history is positive for alcohol use disorder.  Pt came to the ED hoping she would be placed on medication to help her depression. When Pt was educated on how anti-depressants can take to reach therapeutic levels. Pt reports hearing this made her feel anxious. Pt sts she is worried her psychiatrist will not see her again until her scheduled appointment on 04/17/2014.   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 Thyroid disease   . Diabetes mellitus, type II   . Constipation   . GERD (gastroesophageal reflux disease)   . Back pain   . Depression   . Anxiety    Axis IV: other psychosocial or environmental problems Axis V: 41 -50 serious symptoms  Past Medical History:  Past Medical History  Diagnosis Date  . Thyroid disease   . Diabetes mellitus, type II   . Constipation   . GERD (gastroesophageal reflux disease)   . Back pain   . Depression   . Anxiety     Past Surgical History  Procedure Laterality Date  . Abdominal  hysterectomy    . Ulcer surgery    . Goiter removed      Family History:  Family History  Problem Relation Age of Onset  . Depression Sister   . Alcohol abuse Brother   . Alcohol abuse Son   . Alcohol abuse Father     Social History:  reports that she has never smoked. She does not have any smokeless tobacco history on file. She reports that she does not drink alcohol or use illicit drugs.  Additional Social History:  Alcohol / Drug Use Pain Medications: denies Prescriptions: Prilosec, Protonix, Phenergan, Xanax, synthroid, Paxil, Seroquel, Zantac Over the Counter: narpoxen two times daily with  meals History of alcohol / drug use?: No history of alcohol / drug abuse  CIWA: CIWA-Ar BP: 122/62 mmHg Pulse Rate: 66 COWS:    Allergies:  Allergies  Allergen Reactions  . Cyclobenzaprine     hallucinations  . Ultram [Tramadol]     hallucinations    Home Medications:  (Not in a hospital admission)  OB/GYN Status:  No LMP recorded. Patient has had a hysterectomy.  General Assessment Data Location of Assessment: [facility] ED Is this a Tele or Face-to-Face Assessment?: Tele Assessment Is this an Initial Assessment or a Re-assessment for this encounter?: Initial Assessment Living Arrangements: Alone Can pt return to current living arrangement?: Yes Admission Status: Voluntary Is patient capable of signing voluntary admission?: Yes Transfer from: Home Referral Source: Self/Family/Friend     [facility] Crisis Care Plan Living Arrangements: Alone Name of Psychiatrist: Dr. Tenny Craw Name of Therapist: none  Education Status Is patient currently in school?: No  Risk to self Suicidal Ideation: No Suicidal Intent: No Is patient at risk for suicide?: No Suicidal Plan?: No Access to Means: No What has been your use of drugs/alcohol within the last 12 months?: none Previous Attempts/Gestures: No How many times?: 0 Other Self Harm Risks: none Triggers for Past Attempts: None known Intentional Self Injurious Behavior: None Family Suicide History: No Recent stressful life event(s):  (reports dreaming about deceased husband, stomach problems) Persecutory voices/beliefs?: No Depression: Yes Depression Symptoms: Despondent;Tearfulness;Isolating;Fatigue;Loss of interest in usual pleasures Substance abuse history and/or treatment for substance abuse?: No Suicide prevention information given to non-admitted patients: Not applicable  Risk to Others Homicidal Ideation: No Thoughts of Harm to Others: No Current Homicidal Intent: No Current Homicidal Plan: No Access to Homicidal Means:  No Identified Victim: none History of harm to others?: No Assessment of Violence: None Noted Violent Behavior Description: none Does patient have access to weapons?: No Criminal Charges Pending?: No Does patient have a court date: No  Psychosis Hallucinations: None noted (1 x hallucination with cough medication) Delusions: None noted  Mental Status Report Appear/Hygiene: In scrubs Eye Contact: Good Motor Activity: Unremarkable Speech: Logical/coherent Level of Consciousness: Alert Mood: Depressed;Anxious Affect: Constricted Anxiety Level: Moderate Thought Processes: Coherent;Relevant Judgement: Unimpaired Orientation: Person;Place;Time;Situation Obsessive Compulsive Thoughts/Behaviors: None  Cognitive Functioning Concentration: Normal Memory: Recent Intact;Remote Intact IQ: Average Insight: Good Impulse Control: Good Appetite: Good Weight Loss: 3 Weight Gain: 0 Sleep: No Change Total Hours of Sleep: 8 Vegetative Symptoms: None  ADLScreening [facility] Assessment Services) Patient's cognitive ability adequate to safely complete daily activities?: Yes Patient able to express need for assistance with ADLs?: Yes Independently performs ADLs?: Yes (appropriate for developmental age)  Prior Inpatient Therapy Prior Inpatient Therapy: No  Prior Outpatient Therapy Prior Outpatient Therapy: Yes Prior Therapy Dates: unknown Prior Therapy Facilty/Provider(s): unknown Reason for Treatment: depression, anxiety  ADL Screening (condition at  time of admission) Patient's cognitive ability adequate to safely complete daily activities?: Yes Is the patient deaf or have difficulty hearing?:  (Pt reports she is hard of hearing ) Does the patient have difficulty seeing, even when wearing glasses/contacts?: No Does the patient have difficulty concentrating, remembering, or making decisions?: No Patient able to express need for assistance with ADLs?: Yes Does the patient have difficulty  dressing or bathing?: No Independently performs ADLs?: Yes (appropriate for developmental age)       Abuse/Neglect Assessment (Assessment to be complete while patient is alone) Physical Abuse: Denies Verbal Abuse: Denies Sexual Abuse: Denies Exploitation of patient/patient's resources: Denies Self-Neglect: Denies Values / Beliefs Cultural Requests During Hospitalization: None Spiritual Requests During Hospitalization: Other (comment) (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 Pt sts she attends church three times per week. )     Nutrition Screen- [facility] Adult/[facility]/AP Patient's home diet: Regular (Pt reports she is a "slight diabetic")  Additional Information 1:1 In Past 12 Months?: No CIRT Risk: No Elopement Risk: No Does patient have medical clearance?: Yes     Disposition:   Pt does not meet inpt criteria Per Renata Caprice, NP. Pt can be discharged back to her OP providers. Spoke with Marlon Pel PA-C about this plan of action and she is in agreement. Spoke with Nadine Counts, RN who will review referral with Pt.    Clista Bernhardt, [facility] Triage Specialist 03/23/2014 11:04 PM

## 2014-03-24 ENCOUNTER — Telehealth (HOSPITAL_COMMUNITY): Payer: Self-pay | Admitting: *Deleted

## 2014-03-24 NOTE — ED Provider Notes (Signed)
Disposition:  Pt does not meet inpt criteria Per Renata Capriceonrad, NP. Pt can be discharged back to her OP providers. Spoke with Marlon Peliffany Orley Lawry PA-C about this plan of action and she is in agreement. Spoke with Nadine CountsBob, RN who will review referral with Pt.  Clista BernhardtNancy Stephenson, [facility]  Triage Specialist  03/23/2014 11:04 PM    Pt to be discharged and will follow-up as recommended by ACT.  Dorthula Matasiffany G Latreece Mochizuki, PA-C 03/24/14 [phone]

## 2014-03-24 NOTE — Discharge Instructions (Signed)
Generalized Anxiety Disorder  Generalized anxiety disorder (GAD) is a mental disorder. It interferes with life functions, including relationships, work, and school.  GAD is different from normal anxiety, which everyone experiences at some point in their lives in response to specific life events and activities. Normal anxiety actually helps us prepare for and get through these life events and activities. Normal anxiety goes away after the event or activity is over.   GAD causes anxiety that is not necessarily related to specific events or activities. It also causes excess anxiety in proportion to specific events or activities. The anxiety associated with GAD is also difficult to control. GAD can vary from mild to severe. People with severe GAD can have intense waves of anxiety with physical symptoms (panic attacks).   SYMPTOMS  The anxiety and worry associated with GAD are difficult to control. This anxiety and worry are related to many life events and activities and also occur more days than not for 6 months or longer. People with GAD also have three or more of the following symptoms (one or more in children):  · Restlessness.    · Fatigue.  · Difficulty concentrating.    · Irritability.  · Muscle tension.  · Difficulty sleeping or unsatisfying sleep.  DIAGNOSIS  GAD is diagnosed through an assessment by your caregiver. Your caregiver will ask you questions about your mood, physical symptoms, and events in your life. Your caregiver may ask you about your medical history and use of alcohol or drugs, including prescription medications. Your caregiver may also do a physical exam and blood tests. Certain medical conditions and the use of certain substances can cause symptoms similar to those associated with GAD. Your caregiver may refer you to a mental health specialist for further evaluation.  TREATMENT  The following therapies are usually used to treat GAD:   · Medication--Antidepressant medication usually is  prescribed for long-term daily control. Antianxiety medications may be added in severe cases, especially when panic attacks occur.    · Talk therapy (psychotherapy)--Certain types of talk therapy can be helpful in treating GAD by providing support, education, and guidance. A form of talk therapy called cognitive behavioral therapy can teach you healthy ways to think about and react to daily life events and activities.  · Stress management techniques--These include yoga, meditation, and exercise and can be very helpful when they are practiced regularly.  A mental health specialist can help determine which treatment is best for you. Some people see improvement with one therapy. However, other people require a combination of therapies.  Document Released: 12/17/2012 Document Reviewed: 12/17/2012  [employer]® Patient Information ©2015 [employer]. This information is not intended to replace advice given to you by your health care provider. Make sure you discuss any questions you have with your health care provider.

## 2014-03-24 NOTE — ED Provider Notes (Signed)
Medical screening examination/treatment/procedure(s) were performed by non-physician practitioner and as supervising physician I was immediately available for consultation/collaboration.   EKG Interpretation   Date/Time:  Sunday March 23 2014 18:56:36 EDT Ventricular Rate:  70 PR Interval:  154 QRS Duration: 91 QT Interval:  401 QTC Calculation: 433 R Axis:   -18 Text Interpretation:  Sinus rhythm Borderline left axis deviation Abnormal  R-wave progression, early transition No old tracing to compare Confirmed  by GOLDSTON  MD, SCOTT ([id number]) on 03/23/2014 7:55:07 PM       Olivia Mackielga M Ellianne Gowen, MD 03/24/14 [phone]

## 2014-03-24 NOTE — ED Provider Notes (Signed)
Medical screening examination/treatment/procedure(s) were performed by non-physician practitioner and as supervising physician I was immediately available for consultation/collaboration.   EKG Interpretation   Date/Time:  Sunday March 23 2014 18:56:36 EDT Ventricular Rate:  70 PR Interval:  154 QRS Duration: 91 QT Interval:  401 QTC Calculation: 433 R Axis:   -18 Text Interpretation:  Sinus rhythm Borderline left axis deviation Abnormal  R-wave progression, early transition No old tracing to compare Confirmed  by GOLDSTON  MD, SCOTT [phone]([id number]) on 03/23/2014 7:55:07 PM        Lyanne CoKevin M Tehila Sokolow, MD 03/24/14 [phone]

## 2014-03-25 ENCOUNTER — Telehealth (HOSPITAL_COMMUNITY): Payer: Self-pay | Admitting: *Deleted

## 2014-03-25 NOTE — Telephone Encounter (Signed)
No, she has not tried the valium yet

## 2014-03-25 NOTE — Telephone Encounter (Signed)
Pt is anxious and lonely, feels pins and needles in her arms. Offered to change Xanax to valium and she agrees

## 2014-03-25 NOTE — Telephone Encounter (Signed)
Valium 10 mg #90 called in

## 2014-03-26 ENCOUNTER — Telehealth (HOSPITAL_COMMUNITY): Payer: Self-pay | Admitting: *Deleted

## 2014-03-26 ENCOUNTER — Other Ambulatory Visit (HOSPITAL_COMMUNITY): Payer: Self-pay | Admitting: Psychiatry

## 2014-03-26 ENCOUNTER — Telehealth (HOSPITAL_COMMUNITY): Payer: Self-pay

## 2014-03-26 MED ORDER — ARIPIPRAZOLE 2 MG PO TABS: 2.0000 mg | ORAL_TABLET | Freq: Every day | ORAL | Status: DC

## 2014-03-26 NOTE — Telephone Encounter (Signed)
Already spoke to daughter about patient, Abilify called in

## 2014-03-26 NOTE — Telephone Encounter (Signed)
She will look into this via insurance

## 2014-03-26 NOTE — Telephone Encounter (Signed)
Spoke to daughter at length. Pt actually did well on abilify. Will d/c seroquel and restart abilify 2 mg qhs

## 2014-03-26 NOTE — Telephone Encounter (Signed)
She has been taking phenergan several times a day, told to stop

## 2014-03-27 ENCOUNTER — Telehealth (HOSPITAL_COMMUNITY): Payer: Self-pay | Admitting: *Deleted

## 2014-03-27 NOTE — Telephone Encounter (Signed)
She can take mirtazapine

## 2014-04-07 ENCOUNTER — Telehealth (HOSPITAL_COMMUNITY): Payer: Self-pay | Admitting: *Deleted

## 2014-04-07 NOTE — Telephone Encounter (Signed)
Since it's working with minimal side effects, told to continue

## 2014-04-07 NOTE — Telephone Encounter (Signed)
Pt states her granddaughter thinks its not best for her. Pt states she is feeling well with this medication and dont have any problem with this but she just wanted to advise.

## 2014-04-17 ENCOUNTER — Ambulatory Visit (INDEPENDENT_AMBULATORY_CARE_PROVIDER_SITE_OTHER): Payer: Medicare Other | Admitting: Psychiatry

## 2014-04-17 ENCOUNTER — Encounter (HOSPITAL_COMMUNITY): Payer: Self-pay | Admitting: Psychiatry

## 2014-04-17 VITALS — BP 127/81 | HR 82 | Ht 64.0 in | Wt 190.8 lb

## 2014-04-17 DIAGNOSIS — F332 Major depressive disorder, recurrent severe without psychotic features: Secondary | ICD-10-CM

## 2014-04-17 DIAGNOSIS — F411 Generalized anxiety disorder: Secondary | ICD-10-CM

## 2014-04-17 MED ORDER — ARIPIPRAZOLE 2 MG PO TABS: 2.0000 mg | ORAL_TABLET | Freq: Every day | ORAL | Status: DC

## 2014-04-17 MED ORDER — MIRTAZAPINE 30 MG PO TABS: 30.0000 mg | ORAL_TABLET | Freq: Every day | ORAL | Status: DC

## 2014-04-17 NOTE — Progress Notes (Signed)
Patient ID: Teresa Mccullough, female   DOB: 04-26-1941, 73 y.o.   MRN: [id number] Patient ID: Teresa Mccullough, female   DOB: March 03, 1941, 73 y.o.   MRN: [id number]  Psychiatric Assessment Adult  Patient Identification:  Teresa Mccullough Date of Evaluation:  04/17/2014 Chief Complaint: I'm really nervous History of Chief Complaint:   Chief Complaint  Patient presents with  . Anxiety  . Depression  . Follow-up    Anxiety Symptoms include nervous/anxious behavior.     this patient is a 73 year old widowed white female who lives alone in [place]. Her husband died in 01-13-07. 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  The patient was referred by Dr. Dimas [facility] for treatment of depression.  The patient states that she's had anxiety since her children were born in her 75s. She developed depression in her 75s She's never had suicidal ideation or psychiatric hospitalizations or psychotic symptoms. She's always been treated by her family physician. She's had ups and downs of her life but she's been particularly depressed since her husband died in 2007/01/13 of colon cancer.  Her family Dr. as had her on a combination of Paxil 30 mg, Abilify 2 mg , and Xanax 1 mg up to 4 times a day. She was doing fairly well up until it 2 or 3 months ago. She was cutting the Abilify in half and it wasn't working so she increased it to 2 mg again. Still wasn't working and then her drugstore switched her to a generic brand. Even while going back to the brand name it has not helped. Currently she feels sad all the time and has crying spells. She feels jittery clammy and has hot flashes when she wakes up. She has dreams about her husband and misses him terribly. She takes an over-the-counter sleeping pill. She has no energy. She is very strong in her faith and spends time going to church and visiting friends and family but she has to really push herself.  She has never been suicidal. She has lost her appetite and has lost 25 pounds since January. At times she has had high energy spells at last for several days and she cleans incessantly. However at this point she is very depressed  The patient returns after one month. She is called numerous times since then. We have changed her medicine to a combination of Paxil Abilify Valium and Remeron. She is doing much better. [facility] She's getting out with friends and doing things. She's sleeping well at night it is no longer shaky. She is gaining weight which may be secondary to some of the medications but I've told her to try to cut down her calories. She denies suicidal ideation   Review of Systems  Constitutional: Positive for activity change and unexpected weight change.  HENT: Negative.   Eyes: Negative.   Respiratory: Negative.   Cardiovascular: Negative.   Gastrointestinal: Positive for constipation.  Endocrine: Negative.   Genitourinary: Negative.   Musculoskeletal: Positive for back pain.  Allergic/Immunologic: Negative.   Neurological: Negative.   Hematological: Negative.   Psychiatric/Behavioral: Positive for dysphoric mood. The patient is nervous/anxious.    Physical Exam not done  Depressive Symptoms: depressed mood, anhedonia, psychomotor retardation, fatigue, difficulty concentrating, panic attacks,  (Hypo) Manic Symptoms:   Elevated Mood:  No Irritable Mood:  No Grandiosity:  No Distractibility:  No Labiality of Mood:  Yes Delusions:  No Hallucinations:  No Impulsivity:  No Sexually Inappropriate Behavior:  No Financial Extravagance:  No Flight of Ideas:  No  Anxiety Symptoms: Excessive Worry:  Yes Panic Symptoms:  Yes Agoraphobia:  No Obsessive Compulsive: No  Symptoms: None, Specific Phobias:  No Social Anxiety:  No  Psychotic Symptoms:  Hallucinations: No None Delusions:  No Paranoia:  No   Ideas of Reference:  No  PTSD Symptoms: Ever had  a traumatic exposure:  No Had a traumatic exposure in the last month:  No Re-experiencing: No None Hypervigilance:  No Hyperarousal: No None Avoidance: No None  Traumatic Brain Injury: No   Past Psychiatric History: Diagnosis: Maj. depression   Hospitalizations: None   Outpatient Care: Has seen mental health providers but doesn't remember any names   Substance Abuse Care: none  Self-Mutilation: none  Suicidal Attempts: none  Violent Behaviors: none   Past Medical History:   Past Medical History  Diagnosis Date  . Thyroid disease   . Diabetes mellitus, type II   . Constipation   . GERD (gastroesophageal reflux disease)   . Back pain   . Depression   . Anxiety    History of Loss of Consciousness:  No Seizure History:  No Cardiac History:  No Allergies:   Allergies  Allergen Reactions  . Cyclobenzaprine     hallucinations  . Ultram [Tramadol]     hallucinations   Current Medications:  Current Outpatient Prescriptions  Medication Sig Dispense Refill  . ARIPiprazole (ABILIFY) 2 MG tablet Take 1 tablet (2 mg total) by mouth daily.  30 tablet  2  . diazepam (VALIUM) 10 MG tablet Take 10 mg by mouth every 6 (six) hours as needed for anxiety.      Marland Kitchen. levothyroxine (SYNTHROID, LEVOTHROID) 75 MCG tablet Take 75 mcg by mouth daily before breakfast.      . metFORMIN (GLUCOPHAGE-XR) 500 MG 24 hr tablet Take 500 mg by mouth daily.      . mirtazapine (REMERON) 30 MG tablet Take 1 tablet (30 mg total) by mouth at bedtime.  30 tablet  2  . naproxen (NAPROSYN) 500 MG tablet Take 500 mg by mouth 2 (two) times daily as needed.      Marland Kitchen. omeprazole (PRILOSEC) 20 MG capsule Take 20 mg by mouth daily.      Marland Kitchen. PARoxetine (PAXIL) 30 MG tablet Take 30 mg by mouth daily.      . promethazine (PHENERGAN) 25 MG tablet Take 25 mg by mouth every 6 (six) hours as needed for nausea or vomiting.      . ranitidine (ZANTAC) 150 MG capsule Take 150 mg by mouth every evening.       No current  facility-administered medications for this visit.    Previous Psychotropic Medications:  Medication Dose                          Substance Abuse History in the last 12 months: Substance Age of 1st Use Last Use Amount Specific Type  Nicotine      Alcohol      Cannabis      Opiates      Cocaine      Methamphetamines      LSD      Ecstasy      Benzodiazepines      Caffeine      Inhalants      Others:  Medical Consequences of Substance Abuse: n/a  Legal Consequences of Substance Abuse: n/a  Family Consequences of Substance Abuse: n/a  Blackouts:  No DT's:  No Withdrawal Symptoms:  No None  Social History: Current Place of Residence: [place] of Birth: [place] Family Members: 3 sisters, several grandchildren and great-grandchildren, 3 children Marital Status:  Widowed Children: 3    Education:  GED Educational Problems/Performance:  Religious Beliefs/Practices: Christian History of Abuse: none Armed forces technical officer; housewife Hotel manager History:  None. Legal History: none Hobbies/Interests: Visiting family, cleaning  Family History:   Family History  Problem Relation Age of Onset  . Depression Sister   . Alcohol abuse Brother   . Alcohol abuse Son   . Alcohol abuse Father     Mental Status Examination/Evaluation: Objective:  Appearance: Casual, Neat and Well Groomed  Eye Contact::  Good  Speech:  Slow  Volume:  Decreased  Mood:good  Affect:  Right today   Thought Process:  Circumstantial  Orientation:  Full (Time, Place, and Person)  Thought Content:  Rumination  Suicidal Thoughts:  No  Homicidal Thoughts:  No  Judgement:  Fair  Insight:  Fair  Psychomotor Activity:  Decreased  Akathisia:  No  Handed:  Right  AIMS (if indicated):    Assets:  Communication Skills Desire for Improvement Social Support    Laboratory/X-Ray Psychological Evaluation(s)        Assessment:  Axis  I: Major Depression, Recurrent severe  AXIS I Major Depression, Recurrent severe  AXIS II Deferred  AXIS III Past Medical History  Diagnosis Date  . Thyroid disease   . Diabetes mellitus, type II   . Constipation   . GERD (gastroesophageal reflux disease)   . Back pain   . Depression   . Anxiety      AXIS IV other psychosocial or environmental problems  AXIS V 41-50 serious symptoms   Treatment Plan/Recommendations:  Plan of Care: Medication management   Laboratory:  Psychotherapy: She declines counseling due to the cost   Medications: She'll continue mirtazapine 30 mg at bedtime, Abilify 2 mg per day Paxil 15 mg per day She'll continue Xanax 1 mg 4 times a day and and diazepam 10 mg every 6 hours as needed for anxiety   Routine PRN Medications:  No  Consultations:   Safety Concerns:  She denies thoughts of self-harm   Other: She will return in 2 months     Diannia Ruder, MD 8/13/201511:17 AM

## 2014-04-18 ENCOUNTER — Telehealth (HOSPITAL_COMMUNITY): Payer: Self-pay | Admitting: *Deleted

## 2014-04-18 NOTE — Telephone Encounter (Signed)
Valium 10 mg #120, 2 rfs called in.let pt know please

## 2014-04-18 NOTE — Telephone Encounter (Signed)
Need to check with pharmacy about last quantity given

## 2014-04-18 NOTE — Telephone Encounter (Signed)
pt calling stating she has only 5 pills lift of her Valium and her pharmacy is informing her that her next pick up day is the 21st of September. Pt states she would like a call back

## 2014-04-18 NOTE — Telephone Encounter (Signed)
pt calling stating that during her visit 04-16-14 Dr. Tenny Crawoss had stated to her to go ahead take 4 tablets of her Valium if she needed to and pt is stating that she is running low and her last rx had stated to take 3 tablets as needed and would like for Dr. Tenny Crawoss to send in another script stating new directions. Pt would like to have her meds sent to [facility] [phone]. Pt home number is [phone]

## 2014-04-24 ENCOUNTER — Telehealth (HOSPITAL_COMMUNITY): Payer: Self-pay | Admitting: *Deleted

## 2014-04-24 NOTE — Telephone Encounter (Signed)
pt calling to inform Dr.Ross that she only have one tablet left of her Abilify and she do not get paid until the next Wednesday. She is not out of refills.

## 2014-04-24 NOTE — Telephone Encounter (Signed)
I sent in name brand only last time which may have a smaller co-pay

## 2014-04-25 ENCOUNTER — Other Ambulatory Visit (HOSPITAL_COMMUNITY): Payer: Self-pay | Admitting: *Deleted

## 2014-04-25 MED ORDER — ARIPIPRAZOLE 2 MG PO TABS
2.0000 mg | ORAL_TABLET | Freq: Every day | ORAL | Status: DC
Start: 2014-04-25 — End: 2014-05-29

## 2014-04-25 NOTE — Telephone Encounter (Signed)
Pt came in to office this morning stating she went to the pharmacy and they told her the Brand name (Abilify) will cost her a little over 400 dollars and would like for the generic to be sent to her pharmacy instead.

## 2014-04-25 NOTE — Telephone Encounter (Signed)
called pharmacy to inform them per Dr.Ross to change pt Abilify back to generic instead of Brand name due to pt cost. Per Diane she will change in their system. Pt is aware and agrees

## 2014-04-25 NOTE — Telephone Encounter (Signed)
Please call pharmacy to make this change

## 2014-04-25 NOTE — Telephone Encounter (Signed)
called pharmacy to inform them per Dr.Ross to change pt Abilify back to generic instead of Brand name due to pt cost. Per Diane she will change in their system. Pt is aware and agrees 

## 2014-05-15 ENCOUNTER — Telehealth (HOSPITAL_COMMUNITY): Payer: Self-pay | Admitting: *Deleted

## 2014-05-15 NOTE — Telephone Encounter (Signed)
She can break it in half, take no more than 3 a day

## 2014-05-15 NOTE — Telephone Encounter (Signed)
Pt calling in stating that her family are concern about her driving. Pt thinks the Valium she is taking is too strong and she would like to break it in half and need advise. [phone].

## 2014-05-19 ENCOUNTER — Telehealth (HOSPITAL_COMMUNITY): Payer: Self-pay | Admitting: *Deleted

## 2014-05-19 NOTE — Telephone Encounter (Signed)
Pt is aware of Dr. Ross decision and shows understanding 

## 2014-05-19 NOTE — Telephone Encounter (Signed)
Call to come in as she has so many concerns

## 2014-05-19 NOTE — Telephone Encounter (Signed)
Pt calling stating she is getting sleepy and running off the road as she drives and thinks its her Remeron and would like to know what to do. Pt would also like to know if the Paxil goes against the Abilify, pt states her daughter would like to know? Pt would like for Dr. Tenny Craw to call her.  [phone]

## 2014-05-20 NOTE — Telephone Encounter (Signed)
Pt is aware of Dr Tenny Craw decision and agreed and showed understanding.

## 2014-05-29 ENCOUNTER — Ambulatory Visit (INDEPENDENT_AMBULATORY_CARE_PROVIDER_SITE_OTHER): Payer: Medicare Other | Admitting: Psychiatry

## 2014-05-29 ENCOUNTER — Encounter (HOSPITAL_COMMUNITY): Payer: Self-pay | Admitting: Psychiatry

## 2014-05-29 VITALS — BP 120/85 | HR 78 | Ht 64.0 in | Wt 182.8 lb

## 2014-05-29 DIAGNOSIS — F411 Generalized anxiety disorder: Secondary | ICD-10-CM

## 2014-05-29 DIAGNOSIS — F332 Major depressive disorder, recurrent severe without psychotic features: Secondary | ICD-10-CM

## 2014-05-29 MED ORDER — DIAZEPAM 5 MG PO TABS: 5.0000 mg | ORAL_TABLET | Freq: Two times a day (BID) | ORAL | Status: DC

## 2014-05-29 MED ORDER — PAROXETINE HCL 30 MG PO TABS: 30.0000 mg | ORAL_TABLET | Freq: Every day | ORAL | Status: DC

## 2014-05-29 MED ORDER — MIRTAZAPINE 30 MG PO TABS: 30.0000 mg | ORAL_TABLET | Freq: Every day | ORAL | Status: DC

## 2014-05-29 MED ORDER — ARIPIPRAZOLE 2 MG PO TABS: 2.0000 mg | ORAL_TABLET | Freq: Every day | ORAL | Status: DC

## 2014-05-29 NOTE — Progress Notes (Signed)
Patient ID: Teresa Mccullough, female   DOB: [birth year], 73 y.o.   MRN: [id number] Patient ID: Teresa Mccullough, female   DOB: October 19, 1940, 73 y.o.   MRN: [id number] Patient ID: Teresa Mccullough, female   DOB: Mar 27, 1941, 73 y.o.   MRN: [id number]  Psychiatric Assessment Adult  Patient Identification:  Teresa Mccullough Date of Evaluation:  05/29/2014 Chief Complaint: I'm really nervous History of Chief Complaint:   Chief Complaint  Patient presents with  . Anxiety  . Depression  . Manic Behavior  . Follow-up    Anxiety Symptoms include nervous/anxious behavior.     this patient is a 73 year old widowed white female who lives alone in [place]. Her husband died in 01-28-07. 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  The patient was referred by Dr[facility] Dim[facility] for treatment of depression.  The patient states that she's had anxiety since her children were born in her 69s. She developed depression in her 30s. She's never had suicidal ideation or psychiatric hospitalizations or psychotic symptoms. She's always been treated by her family physician. She's had ups and downs of her life but she's been particularly depressed since her husband died in 2007/01/28 of colon cancer.  Her family Dr. as had her on a combination of Paxil 30 mg, Abilify 2 mg , and Xanax 1 mg up to 4 times a day. She was doing fairly well up until it 2 or 3 months ago. She was cutting the Abilify in half and it wasn't working so she increased it to 2 mg again. Still wasn't working and then her drugstore switched her to a generic brand. Even while going back to the brand name it has not helped. Currently she feels sad all the time and has crying spells. She feels jittery clammy and has hot flashes when she wakes up. She has dreams about her husband and misses him terribly. She takes an over-the-counter sleeping pill. She has no energy. She is very strong in her faith and  spends time going to church and visiting friends and family but she has to really push herself. She has never been suicidal. She has lost her appetite and has lost 25 pounds since January. At times she has had high energy spells at last for several days and she cleans incessantly. However at this point she is very depressed  The patient returns after 2 months. She is called numerous times the last month cleaning her Valium was too strong and she was running off the road. I told her to cut it down to 5 mg twice a day. She's here today with her granddaughter who reports that the patient has not been safe to drive because she is too drowsy. She's cut down her Paxil and her Remeron and now she's not sleeping at night and wandering around and [facility] I explained to her that until she starts sleeping through the night and is no longer drowsy through the day it will not be safe for her to drive. She denies significant anxiety that she's had in the past  Review of Systems  Constitutional: Positive for activity change and unexpected weight change.  HENT: Negative.   Eyes: Negative.   Respiratory: Negative.   Cardiovascular: Negative.   Gastrointestinal: Positive for constipation.  Endocrine: Negative.   Genitourinary: Negative.   Musculoskeletal: Positive for back pain.  Allergic/Immunologic: Negative.  Neurological: Negative.   Hematological: Negative.   Psychiatric/Behavioral: Positive for dysphoric mood. The patient is nervous/anxious.    Physical Exam not done  Depressive Symptoms: depressed mood, anhedonia, psychomotor retardation, fatigue, difficulty concentrating, panic attacks,  (Hypo) Manic Symptoms:   Elevated Mood:  No Irritable Mood:  No Grandiosity:  No Distractibility:  No Labiality of Mood:  Yes Delusions:  No Hallucinations:  No Impulsivity:  No Sexually Inappropriate Behavior:  No Financial Extravagance:  No Flight of Ideas:  No  Anxiety  Symptoms: Excessive Worry:  Yes Panic Symptoms:  Yes Agoraphobia:  No Obsessive Compulsive: No  Symptoms: None, Specific Phobias:  No Social Anxiety:  No  Psychotic Symptoms:  Hallucinations: No None Delusions:  No Paranoia:  No   Ideas of Reference:  No  PTSD Symptoms: Ever had a traumatic exposure:  No Had a traumatic exposure in the last month:  No Re-experiencing: No None Hypervigilance:  No Hyperarousal: No None Avoidance: No None  Traumatic Brain Injury: No   Past Psychiatric History: Diagnosis: Maj. depression   Hospitalizations: None   Outpatient Care: Has seen mental health providers but doesn't remember any names   Substance Abuse Care: none  Self-Mutilation: none  Suicidal Attempts: none  Violent Behaviors: none   Past Medical History:   Past Medical History  Diagnosis Date  . Thyroid disease   . Diabetes mellitus, type II   . Constipation   . GERD (gastroesophageal reflux disease)   . Back pain   . Depression   . Anxiety    History of Loss of Consciousness:  No Seizure History:  No Cardiac History:  No Allergies:   Allergies  Allergen Reactions  . Cyclobenzaprine     hallucinations  . Ultram [Tramadol]     hallucinations   Current Medications:  Current Outpatient Prescriptions  Medication Sig Dispense Refill  . ARIPiprazole (ABILIFY) 2 MG tablet Take 1 tablet (2 mg total) by mouth daily.  30 tablet  2  . diazepam (VALIUM) 10 MG tablet Take 5 mg by mouth 2 (two) times daily.       Marland Kitchen levothyroxine (SYNTHROID, LEVOTHROID) 75 MCG tablet Take 75 mcg by mouth daily before breakfast.      . metFORMIN (GLUCOPHAGE-XR) 500 MG 24 hr tablet Take 500 mg by mouth daily.      . mirtazapine (REMERON) 30 MG tablet Take 1 tablet (30 mg total) by mouth at bedtime.  30 tablet  2  . omeprazole (PRILOSEC) 20 MG capsule Take 20 mg by mouth daily.      Marland Kitchen PARoxetine (PAXIL) 30 MG tablet Take 1 tablet (30 mg total) by mouth at bedtime.  30 tablet  2  . promethazine  (PHENERGAN) 25 MG tablet Take 25 mg by mouth every 6 (six) hours as needed for nausea or vomiting.      . diazepam (VALIUM) 5 MG tablet Take 1 tablet (5 mg total) by mouth 2 (two) times daily.  60 tablet  2   No current facility-administered medications for this visit.    Previous Psychotropic Medications:  Medication Dose                          Substance Abuse History in the last 12 months: Substance Age of 1st Use Last Use Amount Specific Type  Nicotine      Alcohol      Cannabis      Opiates      Cocaine  Methamphetamines      LSD      Ecstasy      Benzodiazepines      Caffeine      Inhalants      Others:                          Medical Consequences of Substance Abuse: n/a  Legal Consequences of Substance Abuse: n/a  Family Consequences of Substance Abuse: n/a  Blackouts:  No DT's:  No Withdrawal Symptoms:  No None  Social History: Current Place of Residence: [place] of Birth: [place] Family Members: 3 sisters, several grandchildren and great-grandchildren, 3 children Marital Status:  Widowed Children: 3    Education:  GED Educational Problems/Performance:  Religious Beliefs/Practices: Christian History of Abuse: none Armed forces technical officer; housewife Hotel manager History:  None. Legal History: none Hobbies/Interests: Visiting family, cleaning  Family History:   Family History  Problem Relation Age of Onset  . Depression Sister   . Alcohol abuse Brother   . Alcohol abuse Son   . Alcohol abuse Father     Mental Status Examination/Evaluation: Objective:  Appearance: Casual, Neat and Well Groomed  Eye Contact::  Good  Speech:  Slow  Volume:  Decreased  Mood:good but somewhat irritable and insistent about being allowed to drive   Affect:  Fairly good today, not agitated or anxious   Thought Process:  Circumstantial  Orientation:  Full (Time, Place, and Person)  Thought Content:  Rumination  Suicidal  Thoughts:  No  Homicidal Thoughts:  No  Judgement:  Fair  Insight:  Fair  Psychomotor Activity:  Decreased  Akathisia:  No  Handed:  Right  AIMS (if indicated):    Assets:  Communication Skills Desire for Improvement Social Support    Laboratory/X-Ray Psychological Evaluation(s)        Assessment:  Axis I: Major Depression, Recurrent severe  AXIS I Major Depression, Recurrent severe  AXIS II Deferred  AXIS III Past Medical History  Diagnosis Date  . Thyroid disease   . Diabetes mellitus, type II   . Constipation   . GERD (gastroesophageal reflux disease)   . Back pain   . Depression   . Anxiety      AXIS IV other psychosocial or environmental problems  AXIS V 41-50 serious symptoms   Treatment Plan/Recommendations:  Plan of Care: Medication management   Laboratory:  Psychotherapy: She declines counseling due to the cost   Medications: She'll continue mirtazapine 30 mg at bedtime, Abilify 2 mg per day Paxil 30 mg per day She'll continue  diazepam 5 mg twice a day to   Routine PRN Medications:  No  Consultations:   Safety Concerns:  She denies thoughts of self-harm   Other: She will return in 2 months .her granddaughter will call me in a couple of weeks to let me know how she's doing with sleep and alertness through the day. If she is more alert I would agree with her driving again     Diannia Ruder, MD 9/24/20154:40 PM

## 2014-05-30 ENCOUNTER — Ambulatory Visit (HOSPITAL_COMMUNITY): Payer: Self-pay | Admitting: Psychiatry

## 2014-06-05 ENCOUNTER — Telehealth (HOSPITAL_COMMUNITY): Payer: Self-pay | Admitting: *Deleted

## 2014-06-05 NOTE — Telephone Encounter (Signed)
Spoke to pt granddaughter to inform her that pt called our office today. Per pt daughter asking office to call her if pt keeps calling our office. Per granddaughter to let Dr. Tenny Crawoss know that that for the past several days that pt  Have not been sleeping well. Per granddaughter, pt wakes up at 4-5 am and stays up all morning and about mid day, pt is very sleep and would like to know what to do. Per granddaughter pt is taking her medications the way she is suppose to take it and wanted to get advise from Dr. Tenny Crawoss. [phone].

## 2014-06-05 NOTE — Telephone Encounter (Signed)
I need to hear from grandaughter as to how she is doing before I will agree to let her drive

## 2014-06-06 ENCOUNTER — Telehealth (HOSPITAL_COMMUNITY): Payer: Self-pay | Admitting: *Deleted

## 2014-06-06 NOTE — Telephone Encounter (Signed)
Pt calling stating that her hair dresser gaved her a card to go online and order her Abilify from [place]. Patient asked for my advise on what she should do. I advised pt that unfortunatly I am not able to give her advise on situation like that. Pt agreed.

## 2014-06-06 NOTE — Telephone Encounter (Signed)
Told grandaughter not to let pt sleep in the daytime. She can take one valium if she awakens in the night

## 2014-06-06 NOTE — Telephone Encounter (Signed)
noted 

## 2014-06-26 ENCOUNTER — Ambulatory Visit (HOSPITAL_COMMUNITY): Payer: Self-pay | Admitting: Psychiatry

## 2014-08-04 ENCOUNTER — Encounter (HOSPITAL_COMMUNITY): Payer: Self-pay | Admitting: Psychiatry

## 2014-08-04 ENCOUNTER — Ambulatory Visit (INDEPENDENT_AMBULATORY_CARE_PROVIDER_SITE_OTHER): Payer: Medicare Other | Admitting: Psychiatry

## 2014-08-04 VITALS — BP 156/85 | HR 77 | Ht 64.0 in | Wt 183.2 lb

## 2014-08-04 DIAGNOSIS — F411 Generalized anxiety disorder: Secondary | ICD-10-CM

## 2014-08-04 DIAGNOSIS — F332 Major depressive disorder, recurrent severe without psychotic features: Secondary | ICD-10-CM

## 2014-08-04 MED ORDER — PAROXETINE HCL 30 MG PO TABS: 30.0000 mg | ORAL_TABLET | Freq: Every day | ORAL | Status: DC

## 2014-08-04 MED ORDER — MIRTAZAPINE 30 MG PO TABS: 30.0000 mg | ORAL_TABLET | Freq: Every day | ORAL | Status: DC

## 2014-08-04 MED ORDER — DIAZEPAM 10 MG PO TABS: ORAL_TABLET | ORAL | Status: DC

## 2014-08-04 MED ORDER — DIAZEPAM 10 MG PO TABS
ORAL_TABLET | ORAL | Status: DC
Start: 2014-08-04 — End: 2014-08-04

## 2014-08-04 NOTE — Progress Notes (Signed)
Patient ID: Teresa Mccullough, female   DOB: [birth year], 73 y.o.   MRN: [id number] Patient ID: Teresa Mccullough, female   DOB: 1941-08-16, 73 y.o.   MRN: [id number] Patient ID: Teresa Mccullough, female   DOB: 02-23-1941, 73 y.o.   MRN: [id number] Patient ID: Teresa Mccullough, female   DOB: Mar 24, 1941, 73 y.o.   MRN: [id number]  Psychiatric Assessment Adult  Patient Identification:  Teresa Mccullough Date of Evaluation:  08/04/2014 Chief Complaint: I'm doing a little better History of Chief Complaint:   Chief Complaint  Patient presents with  . Depression  . Anxiety  . Follow-up    Anxiety Symptoms include nervous/anxious behavior.     this patient is a 73 year old widowed white female who lives alone in [place]. Her husband died in 15-Jan-2007. 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  The patient was referred by [facility] of depression.  The patient states that she's had anxiety since her children were born in her 29s. She developed depression in her 30s. She's never had suicidal ideation or psychiatric hospitalizations or psychotic symptoms. She's always been treated by her family physician. She's had ups and downs of her life but she's been particularly depressed since her husband died in Jan 15, 2007 of colon cancer.  Her family Dr. as had her on a combination of Paxil 30 mg, Abilify 2 mg , and Xanax 1 mg up to 4 times a day. She was doing fairly well up until it 2 or 3 months ago. She was cutting the Abilify in half and it wasn't working so she increased it to 2 mg again. Still wasn't working and then her drugstore switched her to a generic brand. Even while going back to the brand name it has not helped. Currently she feels sad all the time and has crying spells. She feels jittery clammy and has hot flashes when she wakes up. She has dreams about her husband and misses him terribly. She takes an over-the-counter  sleeping pill. She has no energy. She is very strong in her faith and spends time going to church and visiting friends and family but she has to really push herself. She has never been suicidal. She has lost her appetite and has lost 25 pounds since January. At times she has had high energy spells at last for several days and she cleans incessantly. However at this point she is very depressed  The patient returns after 2 months. She claims she is now getting her Abilify from [place]. I'm not sure how it's being prescribed but I told her that I legally couldn't participating in something like this. She states that she gets a 10 mg tablet and cuts it in half. She claims she can afford getting it in [place]. She's taking Valium 5 mg in the morning and 10 mg at night, Remeron 15 mg at bedtime and Paxil 30 mg at bedtime. She claims her mood is been stable and she is less anxious she still sometimes has difficulty sleeping  Review of Systems  Constitutional: Positive for activity change and unexpected weight change.  HENT: Negative.   Eyes: Negative.   Respiratory: Negative.   Cardiovascular: Negative.   Gastrointestinal: Positive for constipation.  Endocrine: Negative.   Genitourinary: Negative.   Musculoskeletal: Positive for back pain.  Allergic/Immunologic: Negative.   Neurological: Negative.   Hematological: Negative.   Psychiatric/Behavioral: Positive for dysphoric mood.  The patient is nervous/anxious.    Physical Exam not done  Depressive Symptoms: depressed mood, anhedonia, psychomotor retardation, fatigue, difficulty concentrating, panic attacks,  (Hypo) Manic Symptoms:   Elevated Mood:  No Irritable Mood:  No Grandiosity:  No Distractibility:  No Labiality of Mood:  Yes Delusions:  No Hallucinations:  No Impulsivity:  No Sexually Inappropriate Behavior:  No Financial Extravagance:  No Flight of Ideas:  No  Anxiety Symptoms: Excessive Worry:  Yes Panic  Symptoms:  Yes Agoraphobia:  No Obsessive Compulsive: No  Symptoms: None, Specific Phobias:  No Social Anxiety:  No  Psychotic Symptoms:  Hallucinations: No None Delusions:  No Paranoia:  No   Ideas of Reference:  No  PTSD Symptoms: Ever had a traumatic exposure:  No Had a traumatic exposure in the last month:  No Re-experiencing: No None Hypervigilance:  No Hyperarousal: No None Avoidance: No None  Traumatic Brain Injury: No   Past Psychiatric History: Diagnosis: Maj. depression   Hospitalizations: None   Outpatient Care: Has seen mental health providers but doesn't remember any names   Substance Abuse Care: none  Self-Mutilation: none  Suicidal Attempts: none  Violent Behaviors: none   Past Medical History:   Past Medical History  Diagnosis Date  . Thyroid disease   . Diabetes mellitus, type II   . Constipation   . GERD (gastroesophageal reflux disease)   . Back pain   . Depression   . Anxiety    History of Loss of Consciousness:  No Seizure History:  No Cardiac History:  No Allergies:   Allergies  Allergen Reactions  . Cyclobenzaprine     hallucinations  . Ultram [Tramadol]     hallucinations   Current Medications:  Current Outpatient Prescriptions  Medication Sig Dispense Refill  . ARIPiprazole (ABILIFY) 2 MG tablet Take 1 tablet (2 mg total) by mouth daily. 30 tablet 2  . diazepam (VALIUM) 10 MG tablet Take  One half in the am and one at bedtime 45 tablet 2  . levothyroxine (SYNTHROID, LEVOTHROID) 75 MCG tablet Take 75 mcg by mouth daily before breakfast.    . metFORMIN (GLUCOPHAGE-XR) 500 MG 24 hr tablet Take 500 mg by mouth daily.    . mirtazapine (REMERON) 30 MG tablet Take 1 tablet (30 mg total) by mouth at bedtime. 30 tablet 2  . omeprazole (PRILOSEC) 20 MG capsule Take 20 mg by mouth daily.    Marland Kitchen. PARoxetine (PAXIL) 30 MG tablet Take 1 tablet (30 mg total) by mouth at bedtime. 30 tablet 2  . promethazine (PHENERGAN) 25 MG tablet Take 25 mg by  mouth every 6 (six) hours as needed for nausea or vomiting.     No current facility-administered medications for this visit.    Previous Psychotropic Medications:  Medication Dose                          Substance Abuse History in the last 12 months: Substance Age of 1st Use Last Use Amount Specific Type  Nicotine      Alcohol      Cannabis      Opiates      Cocaine      Methamphetamines      LSD      Ecstasy      Benzodiazepines      Caffeine      Inhalants      Others:  Medical Consequences of Substance Abuse: n/a  Legal Consequences of Substance Abuse: n/a  Family Consequences of Substance Abuse: n/a  Blackouts:  No DT's:  No Withdrawal Symptoms:  No None  Social History: Current Place of Residence: [place] Place of Birth[place] Family Members: 3 sisters,[place] several grandchildren and great-grandchildren, 3 children Marital Status:  Widowed Children: 3    Education:  GED Educational Problems/Performance:  Religious Beliefs/Practices: Christian History of Abuse: none Armed forces technical officerccupational Experiences; housewife Hotel managerMilitary History:  None. Legal History: none Hobbies/Interests: Visiting family, cleaning  Family History:   Family History  Problem Relation Age of Onset  . Depression Sister   . Alcohol abuse Brother   . Alcohol abuse Son   . Alcohol abuse Father     Mental Status Examination/Evaluation: Objective:  Appearance: Casual, Neat and Well Groomed  Eye Contact::  Good  Speech:  Slow  Volume:  Decreased  Mood:good   Affect:  Fairly good today, not agitated or anxious   Thought Process:  Circumstantial  Orientation:  Full (Time, Place, and Person)  Thought Content:  Rumination  Suicidal Thoughts:  No  Homicidal Thoughts:  No  Judgement:  Fair  Insight:  Fair  Psychomotor Activity:  Decreased  Akathisia:  No  Handed:  Right  AIMS (if indicated):    Assets:  Communication Skills Desire for  Improvement Social Support    Laboratory/X-Ray Psychological Evaluation(s)        Assessment:  Axis I: Major Depression, Recurrent severe  AXIS I Major Depression, Recurrent severe  AXIS II Deferred  AXIS III Past Medical History  Diagnosis Date  . Thyroid disease   . Diabetes mellitus, type II   . Constipation   . GERD (gastroesophageal reflux disease)   . Back pain   . Depression   . Anxiety      AXIS IV other psychosocial or environmental problems  AXIS V 41-50 serious symptoms   Treatment Plan/Recommendations:  Plan of Care: Medication management   Laboratory:  Psychotherapy: She declines counseling due to the cost   Medications: She'll continue mirtazapine 30 mg at bedtime Paxil 30 mg per day She'll continue  diazepam 5 mg in the morning and 10 mg bedtime. I told her I could not participating getting her any medications from another country. [facility] but she declined   Routine PRN Medications:  No  Consultations:   Safety Concerns:  She denies thoughts of self-harm   Other: She will return in 3 months     ROSS, Gavin PoundEBORAH, MD 11/30/20151:27 PM

## 2014-11-03 ENCOUNTER — Encounter (HOSPITAL_COMMUNITY): Payer: Self-pay | Admitting: *Deleted

## 2014-11-03 ENCOUNTER — Ambulatory Visit (HOSPITAL_COMMUNITY): Payer: Self-pay | Admitting: Psychiatry

## 2014-11-05 DIAGNOSIS — J309 Allergic rhinitis, unspecified: Secondary | ICD-10-CM | POA: Diagnosis not present

## 2014-11-06 DIAGNOSIS — E039 Hypothyroidism, unspecified: Secondary | ICD-10-CM | POA: Diagnosis not present

## 2014-11-06 DIAGNOSIS — J9811 Atelectasis: Secondary | ICD-10-CM | POA: Diagnosis not present

## 2014-11-06 DIAGNOSIS — R109 Unspecified abdominal pain: Secondary | ICD-10-CM | POA: Diagnosis not present

## 2014-11-06 DIAGNOSIS — J45901 Unspecified asthma with (acute) exacerbation: Secondary | ICD-10-CM | POA: Diagnosis not present

## 2014-11-06 DIAGNOSIS — N10 Acute tubulo-interstitial nephritis: Secondary | ICD-10-CM | POA: Diagnosis not present

## 2014-11-06 DIAGNOSIS — Z79899 Other long term (current) drug therapy: Secondary | ICD-10-CM | POA: Diagnosis not present

## 2014-11-06 DIAGNOSIS — R0902 Hypoxemia: Secondary | ICD-10-CM | POA: Diagnosis not present

## 2014-11-06 DIAGNOSIS — R509 Fever, unspecified: Secondary | ICD-10-CM | POA: Diagnosis not present

## 2014-11-06 DIAGNOSIS — E119 Type 2 diabetes mellitus without complications: Secondary | ICD-10-CM | POA: Diagnosis not present

## 2014-11-06 DIAGNOSIS — A419 Sepsis, unspecified organism: Secondary | ICD-10-CM | POA: Diagnosis not present

## 2014-11-06 DIAGNOSIS — B9629 Other Escherichia coli [E. coli] as the cause of diseases classified elsewhere: Secondary | ICD-10-CM | POA: Diagnosis not present

## 2014-11-06 DIAGNOSIS — K219 Gastro-esophageal reflux disease without esophagitis: Secondary | ICD-10-CM | POA: Diagnosis not present

## 2014-11-06 DIAGNOSIS — E669 Obesity, unspecified: Secondary | ICD-10-CM | POA: Diagnosis not present

## 2014-11-06 DIAGNOSIS — R110 Nausea: Secondary | ICD-10-CM | POA: Diagnosis not present

## 2014-11-06 DIAGNOSIS — Z6830 Body mass index (BMI) 30.0-30.9, adult: Secondary | ICD-10-CM | POA: Diagnosis not present

## 2014-11-06 DIAGNOSIS — F332 Major depressive disorder, recurrent severe without psychotic features: Secondary | ICD-10-CM | POA: Diagnosis not present

## 2014-11-06 DIAGNOSIS — T17800A Unspecified foreign body in other parts of respiratory tract causing asphyxiation, initial encounter: Secondary | ICD-10-CM | POA: Diagnosis not present

## 2014-11-06 DIAGNOSIS — Z87891 Personal history of nicotine dependence: Secondary | ICD-10-CM | POA: Diagnosis not present

## 2014-11-06 DIAGNOSIS — F419 Anxiety disorder, unspecified: Secondary | ICD-10-CM | POA: Diagnosis not present

## 2014-11-06 DIAGNOSIS — R05 Cough: Secondary | ICD-10-CM | POA: Diagnosis not present

## 2014-11-06 DIAGNOSIS — K2970 Gastritis, unspecified, without bleeding: Secondary | ICD-10-CM | POA: Diagnosis not present

## 2014-11-09 DIAGNOSIS — N12 Tubulo-interstitial nephritis, not specified as acute or chronic: Secondary | ICD-10-CM | POA: Diagnosis not present

## 2014-11-09 DIAGNOSIS — B9620 Unspecified Escherichia coli [E. coli] as the cause of diseases classified elsewhere: Secondary | ICD-10-CM | POA: Diagnosis not present

## 2014-11-10 ENCOUNTER — Telehealth (HOSPITAL_COMMUNITY): Payer: Self-pay | Admitting: *Deleted

## 2014-11-10 DIAGNOSIS — N12 Tubulo-interstitial nephritis, not specified as acute or chronic: Secondary | ICD-10-CM | POA: Diagnosis not present

## 2014-11-10 DIAGNOSIS — B9620 Unspecified Escherichia coli [E. coli] as the cause of diseases classified elsewhere: Secondary | ICD-10-CM | POA: Diagnosis not present

## 2014-11-10 NOTE — Telephone Encounter (Signed)
CANCEL LATE TODAY.   PATIENT [facility]   REQUEST TO  RESCHEDULE END OF THE MONTH

## 2014-11-11 ENCOUNTER — Ambulatory Visit (HOSPITAL_COMMUNITY): Payer: Self-pay | Admitting: Psychiatry

## 2014-11-11 DIAGNOSIS — N136 Pyonephrosis: Secondary | ICD-10-CM | POA: Diagnosis not present

## 2014-11-12 DIAGNOSIS — A4151 Sepsis due to Escherichia coli [E. coli]: Secondary | ICD-10-CM | POA: Diagnosis not present

## 2014-11-12 DIAGNOSIS — N390 Urinary tract infection, site not specified: Secondary | ICD-10-CM | POA: Diagnosis not present

## 2014-11-12 DIAGNOSIS — E119 Type 2 diabetes mellitus without complications: Secondary | ICD-10-CM | POA: Diagnosis not present

## 2014-11-12 DIAGNOSIS — Z5181 Encounter for therapeutic drug level monitoring: Secondary | ICD-10-CM | POA: Diagnosis not present

## 2014-11-12 DIAGNOSIS — Z4682 Encounter for fitting and adjustment of non-vascular catheter: Secondary | ICD-10-CM | POA: Diagnosis not present

## 2014-11-20 DIAGNOSIS — E039 Hypothyroidism, unspecified: Secondary | ICD-10-CM | POA: Diagnosis not present

## 2014-11-20 DIAGNOSIS — E780 Pure hypercholesterolemia: Secondary | ICD-10-CM | POA: Diagnosis not present

## 2014-11-20 DIAGNOSIS — E119 Type 2 diabetes mellitus without complications: Secondary | ICD-10-CM | POA: Diagnosis not present

## 2014-11-20 DIAGNOSIS — I10 Essential (primary) hypertension: Secondary | ICD-10-CM | POA: Diagnosis not present

## 2014-11-20 DIAGNOSIS — K219 Gastro-esophageal reflux disease without esophagitis: Secondary | ICD-10-CM | POA: Diagnosis not present

## 2014-11-27 ENCOUNTER — Telehealth (HOSPITAL_COMMUNITY): Payer: Self-pay | Admitting: *Deleted

## 2014-11-27 DIAGNOSIS — E039 Hypothyroidism, unspecified: Secondary | ICD-10-CM | POA: Diagnosis not present

## 2014-11-27 DIAGNOSIS — F339 Major depressive disorder, recurrent, unspecified: Secondary | ICD-10-CM | POA: Diagnosis not present

## 2014-11-27 DIAGNOSIS — E119 Type 2 diabetes mellitus without complications: Secondary | ICD-10-CM | POA: Diagnosis not present

## 2014-11-27 DIAGNOSIS — I10 Essential (primary) hypertension: Secondary | ICD-10-CM | POA: Diagnosis not present

## 2014-11-27 DIAGNOSIS — N390 Urinary tract infection, site not specified: Secondary | ICD-10-CM | POA: Diagnosis not present

## 2014-11-27 NOTE — Telephone Encounter (Signed)
please see five page information from patient's PCP received today.

## 2014-11-27 NOTE — Telephone Encounter (Signed)
noted 

## 2014-12-01 ENCOUNTER — Telehealth (HOSPITAL_COMMUNITY): Payer: Self-pay | Admitting: *Deleted

## 2014-12-01 NOTE — Telephone Encounter (Signed)
Pt called stating she would like to cancel her appt and not resch due to not wanting to come back. Per pt she would like to stop seeing Dr. Tenny Crawoss and just let her PCP fill her medication. Pt PCP is Dr. Dimas AguasHoward. Per pt she is doing fine and will just have Dr. Dimas [place]Howard in [place] take care of her medications that Dr. Tenny Crawoss fills for her.

## 2014-12-02 DIAGNOSIS — Z23 Encounter for immunization: Secondary | ICD-10-CM | POA: Diagnosis not present

## 2014-12-02 NOTE — Telephone Encounter (Signed)
Spoke with Lance BoschShea and and she states Dr. Tenny Crawoss will not be in the office until April 4th but she will put a note in his basket to inform him of this and if he has a question he will call Dr. Tenny Crawoss personally. [facility]

## 2014-12-02 NOTE — Telephone Encounter (Signed)
Please call Dr. Jeannette HowHoward's office- [facility] [phone] to see if they agree to handle pts psychiatric meds first

## 2014-12-04 ENCOUNTER — Ambulatory Visit (HOSPITAL_COMMUNITY): Payer: Self-pay | Admitting: Psychiatry

## 2014-12-08 ENCOUNTER — Telehealth (HOSPITAL_COMMUNITY): Payer: Self-pay | Admitting: *Deleted

## 2014-12-08 NOTE — Telephone Encounter (Signed)
Please call patient and explain dr. Jeannette HowHoward's decision. Make sure she has follow up arranged here

## 2014-12-08 NOTE — Telephone Encounter (Signed)
Teresa Mccullough called from Dr. Dimas AguasHoward office called stating that Dr. Dimas AguasHoward will not be able to fill medications from Dr. Tenny Crawoss office. Per pt from previous phone call, she no longer wanted to come to Dr. Tenny Crawoss anymore and Dr. Dimas AguasHoward was going to fill all of Dr. Tenny Crawoss medications.

## 2014-12-09 DIAGNOSIS — N3080 Other cystitis without hematuria: Secondary | ICD-10-CM | POA: Diagnosis not present

## 2014-12-09 DIAGNOSIS — R0902 Hypoxemia: Secondary | ICD-10-CM | POA: Diagnosis not present

## 2014-12-09 NOTE — Telephone Encounter (Signed)
Called pt to see if she would like to come back into office to see Dr. Tenny Crawoss. Per pt have made up her mind and she do not want to come back to see Dr. Tenny Crawoss. Per pt, she only wants to see Dr. Dimas AguasHoward only and is getting her Abilify and P[place]il from [place]. Per pt she will call Dr. Dimas AguasHoward office today.

## 2014-12-10 ENCOUNTER — Telehealth (HOSPITAL_COMMUNITY): Payer: Self-pay | Admitting: *Deleted

## 2014-12-10 NOTE — Telephone Encounter (Signed)
Pt called stating that she would like to see Dr. Tenny Crawoss again but she can only start seeing Dr. Tenny Crawoss after the 25th of May. Per pt she just started making payments towards her new glasses and she can not afford to come before then. Per pt if she is feeling bad before then she will call office back.

## 2014-12-23 DIAGNOSIS — E1165 Type 2 diabetes mellitus with hyperglycemia: Secondary | ICD-10-CM | POA: Diagnosis not present

## 2014-12-30 ENCOUNTER — Encounter (HOSPITAL_COMMUNITY): Payer: Self-pay | Admitting: Psychiatry

## 2014-12-30 ENCOUNTER — Ambulatory Visit (INDEPENDENT_AMBULATORY_CARE_PROVIDER_SITE_OTHER): Payer: Medicare Other | Admitting: Psychiatry

## 2014-12-30 VITALS — BP 135/81 | HR 85 | Ht 64.0 in | Wt 173.2 lb

## 2014-12-30 DIAGNOSIS — F411 Generalized anxiety disorder: Secondary | ICD-10-CM

## 2014-12-30 DIAGNOSIS — F332 Major depressive disorder, recurrent severe without psychotic features: Secondary | ICD-10-CM

## 2014-12-30 MED ORDER — MIRTAZAPINE 30 MG PO TABS: 30.0000 mg | ORAL_TABLET | Freq: Every day | ORAL | Status: DC

## 2014-12-30 MED ORDER — PAROXETINE HCL 30 MG PO TABS: 30.0000 mg | ORAL_TABLET | Freq: Every day | ORAL | Status: DC

## 2014-12-30 MED ORDER — DIAZEPAM 10 MG PO TABS: ORAL_TABLET | ORAL | Status: DC

## 2014-12-30 NOTE — Progress Notes (Signed)
Patient ID: Teresa Mccullough, female   DOB: [birth year], 74 y.o.   MRN: [id number] Patient ID: Teresa Mccullough, female   DOB: 1941/06/25, 74 y.o.   MRN: [id number] Patient ID: Teresa Mccullough, female   DOB: September 15, 1940, 74 y.o.   MRN: [id number] Patient ID: Teresa Mccullough, female   DOB: 01/19/1941, 74 y.o.   MRN: [id number] Patient ID: Teresa Mccullough, female   DOB: 1941/02/26, 74 y.o.   MRN: [id number]  Psychiatric Assessment Adult  Patient Identification:  Teresa Mccullough Date of Evaluation:  12/30/2014 Chief Complaint: I'm doing a little better History of Chief Complaint:   Chief Complaint  Patient presents with  . Depression  . Anxiety  . Follow-up    Anxiety Symptoms include nervous/anxious behavior.     this patient is a 74 year old widowed white female who lives alone in [place]. Her husband died in 02-01-07. 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  The patient was referred by [facility] for [facility] of depression.  The patient states that she's had anxiety since her children were born in her 41s. She developed depression in her 76s. She's never had suicidal ideation or psychiatric hospitalizations or psychotic symptoms. She's always been treated by her family physician. She's had ups and downs of her life but she's been particularly depressed since her husband died in 02-01-2007 of colon cancer.  Her family Dr. as had her on a combination of Paxil 30 mg, Abilify 2 mg , and Xanax 1 mg up to 4 times a day. She was doing fairly well up until it 2 or 3 months ago. She was cutting the Abilify in half and it wasn't working so she increased it to 2 mg again. Still wasn't working and then her drugstore switched her to a generic brand. Even while going back to the brand name it has not helped. Currently she feels sad all the time and has crying spells. She feels jittery clammy and has hot flashes when she wakes up. She has  dreams about her husband and misses him terribly. She takes an over-the-counter sleeping pill. She has no energy. She is very strong in her faith and spends time going to church and visiting friends and family but she has to really push herself. She has never been suicidal. She has lost her appetite and has lost 25 pounds since January. At times she has had high energy spells at last for several days and she cleans incessantly. However at this point she is very depressed  The patient returns after 76 months. She brought in cards and pictures of her late husband and seems to be very focused on this today. She misses him a great deal. She is less anxious and it [place] [facility]. She's sleeping fairly well. She's no longer having anxiety attacks. She is still getting her Abilify from [place] and takes something like 7 mg every other day. She continues the Valium Prozac and mirtazapine and sleeps fairly well. She seems slightly confused today but is still functioning and taking care of herself  Review of Systems  Constitutional: Positive for activity change and unexpected weight change.  HENT: Negative.   Eyes: Negative.   Respiratory: Negative.   Cardiovascular: Negative.   Gastrointestinal: Positive for constipation.  Endocrine: Negative.   Genitourinary: Negative.   Musculoskeletal: Positive for back pain.  Allergic/Immunologic: Negative.  Neurological: Negative.   Hematological: Negative.   Psychiatric/Behavioral: Positive for dysphoric mood. The patient is nervous/anxious.    Physical Exam not done  Depressive Symptoms: depressed mood, anhedonia, psychomotor retardation, fatigue, difficulty concentrating, panic attacks,  (Hypo) Manic Symptoms:   Elevated Mood:  No Irritable Mood:  No Grandiosity:  No Distractibility:  No Labiality of Mood:  Yes Delusions:  No Hallucinations:  No Impulsivity:  No Sexually Inappropriate Behavior:   No Financial Extravagance:  No Flight of Ideas:  No  Anxiety Symptoms: Excessive Worry:  Yes Panic Symptoms:  Yes Agoraphobia:  No Obsessive Compulsive: No  Symptoms: None, Specific Phobias:  No Social Anxiety:  No  Psychotic Symptoms:  Hallucinations: No None Delusions:  No Paranoia:  No   Ideas of Reference:  No  PTSD Symptoms: Ever had a traumatic exposure:  No Had a traumatic exposure in the last month:  No Re-experiencing: No None Hypervigilance:  No Hyperarousal: No None Avoidance: No None  Traumatic Brain Injury: No   Past Psychiatric History: Diagnosis: Maj. depression   Hospitalizations: None   Outpatient Care: Has seen mental health providers but doesn't remember any names   Substance Abuse Care: none  Self-Mutilation: none  Suicidal Attempts: none  Violent Behaviors: none   Past Medical History:   Past Medical History  Diagnosis Date  . Thyroid disease   . Diabetes mellitus, type II   . Constipation   . GERD (gastroesophageal reflux disease)   . Back pain   . Depression   . Anxiety    History of Loss of Consciousness:  No Seizure History:  No Cardiac History:  No Allergies:   Allergies  Allergen Reactions  . Cyclobenzaprine     hallucinations  . Ultram [Tramadol]     hallucinations   Current Medications:  Current Outpatient Prescriptions  Medication Sig Dispense Refill  . ARIPiprazole (ABILIFY) 2 MG tablet Take 1 tablet (2 mg total) by mouth daily. 30 tablet 2  . diazepam (VALIUM) 10 MG tablet Take  One half in the am and one at bedtime 45 tablet 2  . levothyroxine (SYNTHROID, LEVOTHROID) 75 MCG tablet Take 75 mcg by mouth daily before breakfast.    . metFORMIN (GLUCOPHAGE-XR) 500 MG 24 hr tablet Take 500 mg by mouth daily.    . mirtazapine (REMERON) 30 MG tablet Take 1 tablet (30 mg total) by mouth at bedtime. 30 tablet 2  . omeprazole (PRILOSEC) 20 MG capsule Take 20 mg by mouth daily.    Marland Kitchen PARoxetine (PAXIL) 30 MG tablet Take 1 tablet  (30 mg total) by mouth at bedtime. 30 tablet 2  . promethazine (PHENERGAN) 25 MG tablet Take 25 mg by mouth every 6 (six) hours as needed for nausea or vomiting.     No current facility-administered medications for this visit.    Previous Psychotropic Medications:  Medication Dose                          Substance Abuse History in the last 12 months: Substance Age of 1st Use Last Use Amount Specific Type  Nicotine      Alcohol      Cannabis      Opiates      Cocaine      Methamphetamines      LSD      Ecstasy      Benzodiazepines      Caffeine      Inhalants  Others:                          Medical Consequences of Substance Abuse: n/a  Legal Consequences of Substance Abuse: n/a  Family Consequences of Substance Abuse: n/a  Blackouts:  No DT's:  No Withdrawal Symptoms:  No None  Social History: Current Place of Residence: [place] Place of Birth[place] Family Members: 3 sisters, several grandchildren and great-grandchildren, 3 children Marital Status:  Widowed Children: 3    Education:  GED Educational Problems/Performance:  Religious Beliefs/Practices: Christian History of Abuse: none Armed forces technical officerccupational Experiences; housewife Hotel managerMilitary History:  None. Legal History: none Hobbies/Interests: Visiting family, cleaning  Family History:   Family History  Problem Relation Age of Onset  . Depression Sister   . Alcohol abuse Brother   . Alcohol abuse Son   . Alcohol abuse Father     Mental Status Examination/Evaluation: Objective:  Appearance: Casual, Neat and Well Groomed  Eye Contact::  Good  Speech:  Slow  Volume:  Decreased  Mood: A little sad, misses her late husband   Affect:  Fairly good today, not agitated or anxious   Thought Process:  Circumstantial  Orientation:  Full (Time, Place, and Person)  Thought Content:  Rumination  Suicidal Thoughts:  No  Homicidal Thoughts:  No  Judgement:  Fair  Insight:  Fair   Psychomotor Activity:  Decreased  Akathisia:  No  Handed:  Right  AIMS (if indicated):    Assets:  Communication Skills Desire for Improvement Social Support    Laboratory/X-Ray Psychological Evaluation(s)        Assessment:  Axis I: Major Depression, Recurrent severe  AXIS I Major Depression, Recurrent severe  AXIS II Deferred  AXIS III Past Medical History  Diagnosis Date  . Thyroid disease   . Diabetes mellitus, type II   . Constipation   . GERD (gastroesophageal reflux disease)   . Back pain   . Depression   . Anxiety      AXIS IV other psychosocial or environmental problems  AXIS V 41-50 serious symptoms   Treatment Plan/Recommendations:  Plan of Care: Medication management   Laboratory:  Psychotherapy: She declines counseling due to the cost   Medications: She'll continue mirtazapine 30 mg at bedtime Paxil 30 mg per day She'll continue  diazepam 5 mg in the morning and 10 mg bedtime. She continues to get her [place] from [place] and I explained that I can't participate in this.   Routine PRN Medications:  No  Consultations:   Safety Concerns:  She denies thoughts of self-harm   Other: She will return in 3 months     Diannia RuderOSS, Stella Encarnacion, MD 4/26/201611:04 AM

## 2015-01-02 ENCOUNTER — Telehealth (HOSPITAL_COMMUNITY): Payer: Self-pay | Admitting: *Deleted

## 2015-01-02 NOTE — Telephone Encounter (Signed)
Please ask granddaughter to call me-Make sure she has a release

## 2015-01-02 NOTE — Telephone Encounter (Signed)
Pt called stating that her granddaughter took her medications from her because she thinks that she's not taking her medications correctly. Per pt, her granddaughter thinks she is taking her too much of her medications before [facility]. Per pt she do not do that and she would like to know what Dr. Tenny Crawoss thinks about the situation. Pt number is [phone]

## 2015-01-05 ENCOUNTER — Telehealth (HOSPITAL_COMMUNITY): Payer: Self-pay | Admitting: *Deleted

## 2015-01-05 NOTE — Telephone Encounter (Signed)
Informed pt what Dr. Tenny Crawoss said and pt states that her Son and daughter in law can manage her medications. Per pt she would like Dr. Tenny Crawoss to know this.

## 2015-01-05 NOTE — Telephone Encounter (Signed)
Informed pt what Dr. Tenny Crawoss stated and she stated she will let granddaughter know the next time she talks to her.

## 2015-01-05 NOTE — Telephone Encounter (Signed)
grandaughter is managing pt's meds and i agree this is a good idea

## 2015-01-05 NOTE — Telephone Encounter (Signed)
Sana HOLLY'S GRANDDAUGHTER RETURNED YOUR PHONE CALL.

## 2015-01-08 DIAGNOSIS — R0902 Hypoxemia: Secondary | ICD-10-CM | POA: Diagnosis not present

## 2015-01-28 ENCOUNTER — Ambulatory Visit (HOSPITAL_COMMUNITY): Payer: Self-pay | Admitting: Psychiatry

## 2015-02-03 DIAGNOSIS — Z1231 Encounter for screening mammogram for malignant neoplasm of breast: Secondary | ICD-10-CM | POA: Diagnosis not present

## 2015-02-12 DIAGNOSIS — E039 Hypothyroidism, unspecified: Secondary | ICD-10-CM | POA: Diagnosis not present

## 2015-02-12 DIAGNOSIS — F339 Major depressive disorder, recurrent, unspecified: Secondary | ICD-10-CM | POA: Diagnosis not present

## 2015-02-12 DIAGNOSIS — E119 Type 2 diabetes mellitus without complications: Secondary | ICD-10-CM | POA: Diagnosis not present

## 2015-02-26 ENCOUNTER — Telehealth (HOSPITAL_COMMUNITY): Payer: Self-pay | Admitting: *Deleted

## 2015-02-26 NOTE — Telephone Encounter (Signed)
Called pt to resch appt for July 26 due to provider being out of office. Number provided

## 2015-03-10 DIAGNOSIS — R0902 Hypoxemia: Secondary | ICD-10-CM | POA: Diagnosis not present

## 2015-03-26 ENCOUNTER — Telehealth (HOSPITAL_COMMUNITY): Payer: Self-pay | Admitting: *Deleted

## 2015-03-26 ENCOUNTER — Ambulatory Visit (HOSPITAL_COMMUNITY): Payer: Self-pay | Admitting: Psychiatry

## 2015-03-26 NOTE — Telephone Encounter (Signed)
phone call from patient, she need her nerve pill, Diazepam.    She will be out on Saturday.

## 2015-03-26 NOTE — Telephone Encounter (Signed)
Scheduled an appt for pt to come into office for appt and pt agreed

## 2015-03-27 ENCOUNTER — Encounter (HOSPITAL_COMMUNITY): Payer: Self-pay | Admitting: Psychiatry

## 2015-03-27 ENCOUNTER — Ambulatory Visit (INDEPENDENT_AMBULATORY_CARE_PROVIDER_SITE_OTHER): Payer: Medicare Other | Admitting: Psychiatry

## 2015-03-27 VITALS — BP 154/90 | HR 70 | Ht 64.0 in | Wt 175.8 lb

## 2015-03-27 DIAGNOSIS — F411 Generalized anxiety disorder: Secondary | ICD-10-CM

## 2015-03-27 DIAGNOSIS — F332 Major depressive disorder, recurrent severe without psychotic features: Secondary | ICD-10-CM

## 2015-03-27 DIAGNOSIS — F331 Major depressive disorder, recurrent, moderate: Secondary | ICD-10-CM

## 2015-03-27 MED ORDER — DIAZEPAM 10 MG PO TABS: ORAL_TABLET | ORAL | Status: DC

## 2015-03-27 MED ORDER — PAROXETINE HCL 40 MG PO TABS: 40.0000 mg | ORAL_TABLET | Freq: Every day | ORAL | Status: DC

## 2015-03-27 NOTE — Progress Notes (Signed)
Patient ID: MAIYAH GOYNE, female   DOB: March 07, 1941, 74 y.o.   MRN: [id number] Patient ID: TORIANN SPADONI, female   DOB: 1940/10/08, 74 y.o.   MRN: [id number] Patient ID: SANOE HAZAN, female   DOB: 1940-11-29, 74 y.o.   MRN: [id number] Patient ID: ALAILAH SAFLEY, female   DOB: [birth year], 74 y.o.   MRN: [id number] Patient ID: KYNDAL HERINGER, female   DOB: Jan 24, 1941, 74 y.o.   MRN: [id number] Patient ID: IVOREE FELMLEE, female   DOB: 02/14/1941, 74 y.o.   MRN: [id number]  Psychiatric Assessment Adult  Patient Identification:  Teresa Mccullough Date of Evaluation:  03/27/2015 Chief Complaint: I'm  a little down History of Chief Complaint:   Chief Complaint  Patient presents with  . Depression  . Anxiety  . Follow-up    Anxiety Symptoms include nervous/anxious behavior.     this patient is a 74 year old widowed white female who lives alone in [place]. Her husband died in 02-03-2007. 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  The patient was referred by Dr. Dimas Aguas of [facility] for treatment of depression.  The patient states that she's had anxiety since her children were born in her 47s. She developed depression in her 30s. She's never had suicidal ideation or psychiatric hospitalizations or psychotic symptoms. She's always been treated by her family physician. She's had ups and downs of her life but she's been particularly depressed since her husband died in 02-03-2007 of colon cancer.  Her family Dr. as had her on a combination of Paxil 30 mg, Abilify 2 mg , and Xanax 1 mg up to 4 times a day. She was doing fairly well up until it 2 or 3 months ago. She was cutting the Abilify in half and it wasn't working so she increased it to 2 mg again. Still wasn't working and then her drugstore switched her to a generic brand. Even while going back to the brand name it has not helped. Currently she feels sad all the time and has crying spells.  She feels jittery clammy and has hot flashes when she wakes up. She has dreams about her husband and misses him terribly. She takes an over-the-counter sleeping pill. She has no energy. She is very strong in her faith and spends time going to church and visiting friends and family but she has to really push herself. She has never been suicidal. She has lost her appetite and has lost 25 pounds since January. At times she has had high energy spells at last for several days and she cleans incessantly. However at this point she is very depressed  The patient returns after 3 months. She states that she's been a little bit more down lately. She hasn't has much energy. She is no longer getting up early [facility]. She doesn't have much energy to get out with people or say much to anybody. She used to kind of talk nonstop. I suggested we increase her Paxil abated and she agrees. The Valium is still helping her anxiety and she thinks the Abilify helps with her mood is well. She still gets he Abilify 2 company in [place]  Review of Systems  Constitutional: Positive for activity change and unexpected weight change.  HENT: Negative.   Eyes: Negative.   Respiratory: Negative.   Cardiovascular: Negative.   Gastrointestinal: Positive for constipation.  Endocrine: Negative.   Genitourinary: Negative.   Musculoskeletal:  Positive for back pain.  Allergic/Immunologic: Negative.   Neurological: Negative.   Hematological: Negative.   Psychiatric/Behavioral: Positive for dysphoric mood. The patient is nervous/anxious.    Physical Exam not done  Depressive Symptoms: depressed mood, anhedonia, psychomotor retardation, fatigue, difficulty concentrating, panic attacks,  (Hypo) Manic Symptoms:   Elevated Mood:  No Irritable Mood:  No Grandiosity:  No Distractibility:  No Labiality of Mood:  Yes Delusions:  No Hallucinations:  No Impulsivity:  No Sexually Inappropriate Behavior:  No Financial  Extravagance:  No Flight of Ideas:  No  Anxiety Symptoms: Excessive Worry:  Yes Panic Symptoms:  Yes Agoraphobia:  No Obsessive Compulsive: No  Symptoms: None, Specific Phobias:  No Social Anxiety:  No  Psychotic Symptoms:  Hallucinations: No None Delusions:  No Paranoia:  No   Ideas of Reference:  No  PTSD Symptoms: Ever had a traumatic exposure:  No Had a traumatic exposure in the last month:  No Re-experiencing: No None Hypervigilance:  No Hyperarousal: No None Avoidance: No None  Traumatic Brain Injury: No   Past Psychiatric History: Diagnosis: Maj. depression   Hospitalizations: None   Outpatient Care: Has seen mental health providers but doesn't remember any names   Substance Abuse Care: none  Self-Mutilation: none  Suicidal Attempts: none  Violent Behaviors: none   Past Medical History:   Past Medical History  Diagnosis Date  . Thyroid disease   . Diabetes mellitus, type II   . Constipation   . GERD (gastroesophageal reflux disease)   . Back pain   . Depression   . Anxiety    History of Loss of Consciousness:  No Seizure History:  No Cardiac History:  No Allergies:   Allergies  Allergen Reactions  . Cyclobenzaprine     hallucinations  . Ultram [Tramadol]     hallucinations   Current Medications:  Current Outpatient Prescriptions  Medication Sig Dispense Refill  . ARIPiprazole (ABILIFY) 2 MG tablet Take 1 tablet (2 mg total) by mouth daily. 30 tablet 2  . diazepam (VALIUM) 10 MG tablet Take  One half in the am and one at bedtime 45 tablet 2  . levothyroxine (SYNTHROID, LEVOTHROID) 75 MCG tablet Take 75 mcg by mouth daily before breakfast.    . metFORMIN (GLUCOPHAGE-XR) 500 MG 24 hr tablet Take 500 mg by mouth daily.    Marland Kitchen omeprazole (PRILOSEC) 20 MG capsule Take 20 mg by mouth daily.    Marland Kitchen PARoxetine (PAXIL) 40 MG tablet Take 1 tablet (40 mg total) by mouth daily. 30 tablet 2  . promethazine (PHENERGAN) 25 MG tablet Take 25 mg by mouth every 6  (six) hours as needed for nausea or vomiting.     No current facility-administered medications for this visit.    Previous Psychotropic Medications:  Medication Dose                          Substance Abuse History in the last 12 months: Substance Age of 1st Use Last Use Amount Specific Type  Nicotine      Alcohol      Cannabis      Opiates      Cocaine      Methamphetamines      LSD      Ecstasy      Benzodiazepines      Caffeine      Inhalants      Others:  Medical Consequences of Substance Abuse: n/a  Legal Consequences of Substance Abuse: n/a  Family Consequences of Substance Abuse: n/a  Blackouts:  No DT's:  No Withdrawal Symptoms:  No None  Social History: Current Place of Residence: [place] of Birth: [place] Family Members: 3 sisters, several grandchildren and great-grandchildren, 3 children Marital Status:  Widowed Children: 3    Education:  GED Educational Problems/Performance:  Religious Beliefs/Practices: Christian History of Abuse: none Armed forces technical officer; housewife Hotel manager History:  None. Legal History: none Hobbies/Interests: Visiting family, cleaning  Family History:   Family History  Problem Relation Age of Onset  . Depression Sister   . Alcohol abuse Brother   . Alcohol abuse Son   . Alcohol abuse Father     Mental Status Examination/Evaluation: Objective:  Appearance: Casual, Neat and Well Groomed  Eye Contact::  Good  Speech:  Slow  Volume:  Decreased  Mood: Depressed   Affect:  Somewhat blunted   Thought Process:  Circumstantial  Orientation:  Full (Time, Place, and Person)  Thought Content:  Rumination  Suicidal Thoughts:  No  Homicidal Thoughts:  No  Judgement:  Fair  Insight:  Fair  Psychomotor Activity:  Decreased  Akathisia:  No  Handed:  Right  AIMS (if indicated):    Assets:  Communication Skills Desire for Improvement Social Support     Laboratory/X-Ray Psychological Evaluation(s)        Assessment:  Axis I: Major Depression, Recurrent severe  AXIS I Major Depression, Recurrent severe  AXIS II Deferred  AXIS III Past Medical History  Diagnosis Date  . Thyroid disease   . Diabetes mellitus, type II   . Constipation   . GERD (gastroesophageal reflux disease)   . Back pain   . Depression   . Anxiety      AXIS IV other psychosocial or environmental problems  AXIS V 41-50 serious symptoms   Treatment Plan/Recommendations:  Plan of Care: Medication management   Laboratory:  Psychotherapy: She declines counseling due to the cost   Medications: She'll increase Paxil to 40 mg per day for depression. She'll continue  diazepam 5 mg in the morning and 10 mg bedtime for anxiety. She continues to get her Abilify from [place] and I explained that I can't participate in this.   Routine PRN Medications:  No  Consultations:   Safety Concerns:  She denies thoughts of self-harm   Other: She will return in 6 weeks     Diannia Ruder, MD 7/22/20163:02 PM

## 2015-03-31 ENCOUNTER — Ambulatory Visit (HOSPITAL_COMMUNITY): Payer: Self-pay | Admitting: Psychiatry

## 2015-04-10 ENCOUNTER — Ambulatory Visit (HOSPITAL_COMMUNITY): Payer: Self-pay | Admitting: Psychiatry

## 2015-04-16 ENCOUNTER — Telehealth (HOSPITAL_COMMUNITY): Payer: Self-pay | Admitting: *Deleted

## 2015-04-16 NOTE — Telephone Encounter (Signed)
Pt called and lm for Dr. Tenny Craw assistant to call her back. Called pt back at 3:42pm 04-16-15 and lm for her to call office back. Number provided

## 2015-04-17 ENCOUNTER — Telehealth (HOSPITAL_COMMUNITY): Payer: Self-pay | Admitting: *Deleted

## 2015-04-17 ENCOUNTER — Other Ambulatory Visit (HOSPITAL_COMMUNITY): Payer: Self-pay | Admitting: Psychiatry

## 2015-04-17 MED ORDER — DIAZEPAM 10 MG PO TABS: 10.0000 mg | ORAL_TABLET | Freq: Two times a day (BID) | ORAL | Status: DC

## 2015-04-17 MED ORDER — DIAZEPAM 10 MG PO TABS: ORAL_TABLET | ORAL | Status: DC

## 2015-04-17 NOTE — Telephone Encounter (Signed)
Increase valium to 10 mg bid . Pt to pick up

## 2015-04-17 NOTE — Telephone Encounter (Signed)
Called pt due to previous call. Per pt, she feel very jittery. Per pt, she takes all of her medication and do not know which medication could be causing it. Per pt, she knows Dr. Tenny Craw may say to go back on the Abilify but she can not afford it. Per pt with the shaking, she feels sad as well. Pt was last seen 03-27-15. Pt next f/u appt is 05-08-15. Pt number is [phone].

## 2015-04-17 NOTE — Telephone Encounter (Signed)
Called and informed pt that her printed script the provided wrote is ready for pick up. Per pt, she would like to come into office and pick it up on October 2. Informed pt that due to her previous call to consider coming sometimes in the near future because office is trying to help her with her situation. Per pt she will try to come into office sometimes next week to pick script up. Per pt, she can not come today due to her having to go get her hair done.

## 2015-04-17 NOTE — Telephone Encounter (Signed)
Pt came into office to pick up printed script. Pt D/L number is [id number]. Expiration number is 12-09-18

## 2015-04-19 DIAGNOSIS — E039 Hypothyroidism, unspecified: Secondary | ICD-10-CM | POA: Diagnosis not present

## 2015-04-19 DIAGNOSIS — Z87891 Personal history of nicotine dependence: Secondary | ICD-10-CM | POA: Diagnosis not present

## 2015-04-19 DIAGNOSIS — F419 Anxiety disorder, unspecified: Secondary | ICD-10-CM | POA: Diagnosis not present

## 2015-04-19 DIAGNOSIS — R109 Unspecified abdominal pain: Secondary | ICD-10-CM | POA: Diagnosis not present

## 2015-04-19 DIAGNOSIS — K219 Gastro-esophageal reflux disease without esophagitis: Secondary | ICD-10-CM | POA: Diagnosis not present

## 2015-04-19 DIAGNOSIS — M545 Low back pain: Secondary | ICD-10-CM | POA: Diagnosis not present

## 2015-04-19 DIAGNOSIS — Z79899 Other long term (current) drug therapy: Secondary | ICD-10-CM | POA: Diagnosis not present

## 2015-04-22 DIAGNOSIS — E119 Type 2 diabetes mellitus without complications: Secondary | ICD-10-CM | POA: Diagnosis not present

## 2015-04-22 DIAGNOSIS — H5203 Hypermetropia, bilateral: Secondary | ICD-10-CM | POA: Diagnosis not present

## 2015-04-22 DIAGNOSIS — H524 Presbyopia: Secondary | ICD-10-CM | POA: Diagnosis not present

## 2015-04-22 DIAGNOSIS — H52223 Regular astigmatism, bilateral: Secondary | ICD-10-CM | POA: Diagnosis not present

## 2015-04-24 DIAGNOSIS — M545 Low back pain: Secondary | ICD-10-CM | POA: Diagnosis not present

## 2015-04-25 DIAGNOSIS — N3090 Cystitis, unspecified without hematuria: Secondary | ICD-10-CM | POA: Diagnosis not present

## 2015-04-25 DIAGNOSIS — M545 Low back pain: Secondary | ICD-10-CM | POA: Diagnosis not present

## 2015-04-28 DIAGNOSIS — R8290 Unspecified abnormal findings in urine: Secondary | ICD-10-CM | POA: Diagnosis not present

## 2015-04-28 DIAGNOSIS — R109 Unspecified abdominal pain: Secondary | ICD-10-CM | POA: Diagnosis not present

## 2015-04-28 DIAGNOSIS — R1031 Right lower quadrant pain: Secondary | ICD-10-CM | POA: Diagnosis not present

## 2015-04-29 DIAGNOSIS — F419 Anxiety disorder, unspecified: Secondary | ICD-10-CM | POA: Diagnosis not present

## 2015-04-29 DIAGNOSIS — E039 Hypothyroidism, unspecified: Secondary | ICD-10-CM | POA: Diagnosis not present

## 2015-04-29 DIAGNOSIS — R1031 Right lower quadrant pain: Secondary | ICD-10-CM | POA: Diagnosis not present

## 2015-04-29 DIAGNOSIS — M5136 Other intervertebral disc degeneration, lumbar region: Secondary | ICD-10-CM | POA: Diagnosis not present

## 2015-04-29 DIAGNOSIS — G8929 Other chronic pain: Secondary | ICD-10-CM | POA: Diagnosis not present

## 2015-04-29 DIAGNOSIS — R109 Unspecified abdominal pain: Secondary | ICD-10-CM | POA: Diagnosis not present

## 2015-04-29 DIAGNOSIS — Z87442 Personal history of urinary calculi: Secondary | ICD-10-CM | POA: Diagnosis not present

## 2015-04-29 DIAGNOSIS — Z79899 Other long term (current) drug therapy: Secondary | ICD-10-CM | POA: Diagnosis not present

## 2015-04-29 DIAGNOSIS — K219 Gastro-esophageal reflux disease without esophagitis: Secondary | ICD-10-CM | POA: Diagnosis not present

## 2015-04-30 DIAGNOSIS — R1031 Right lower quadrant pain: Secondary | ICD-10-CM | POA: Diagnosis not present

## 2015-05-07 DIAGNOSIS — K219 Gastro-esophageal reflux disease without esophagitis: Secondary | ICD-10-CM | POA: Diagnosis not present

## 2015-05-07 DIAGNOSIS — M5136 Other intervertebral disc degeneration, lumbar region: Secondary | ICD-10-CM | POA: Diagnosis not present

## 2015-05-07 DIAGNOSIS — F419 Anxiety disorder, unspecified: Secondary | ICD-10-CM | POA: Diagnosis not present

## 2015-05-07 DIAGNOSIS — M545 Low back pain: Secondary | ICD-10-CM | POA: Diagnosis not present

## 2015-05-07 DIAGNOSIS — E039 Hypothyroidism, unspecified: Secondary | ICD-10-CM | POA: Diagnosis not present

## 2015-05-07 DIAGNOSIS — Z79899 Other long term (current) drug therapy: Secondary | ICD-10-CM | POA: Diagnosis not present

## 2015-05-08 ENCOUNTER — Ambulatory Visit (HOSPITAL_COMMUNITY): Payer: Self-pay | Admitting: Psychiatry

## 2015-05-08 ENCOUNTER — Ambulatory Visit (INDEPENDENT_AMBULATORY_CARE_PROVIDER_SITE_OTHER): Payer: Medicare Other | Admitting: Psychiatry

## 2015-05-08 ENCOUNTER — Encounter (HOSPITAL_COMMUNITY): Payer: Self-pay | Admitting: Psychiatry

## 2015-05-08 VITALS — BP 143/79 | HR 77 | Ht 64.0 in | Wt 170.4 lb

## 2015-05-08 DIAGNOSIS — F331 Major depressive disorder, recurrent, moderate: Secondary | ICD-10-CM | POA: Diagnosis not present

## 2015-05-08 MED ORDER — MIRTAZAPINE 30 MG PO TABS: 30.0000 mg | ORAL_TABLET | Freq: Every day | ORAL | Status: DC

## 2015-05-08 MED ORDER — PAROXETINE HCL 40 MG PO TABS: 40.0000 mg | ORAL_TABLET | Freq: Every day | ORAL | Status: DC

## 2015-05-08 NOTE — Progress Notes (Signed)
Patient ID: Teresa Mccullough, female   DOB: [birth year], 74 y.o.   MRN: [id number] Patient ID: Teresa Mccullough, female   DOB: 19-Jun-1941, 74 y.o.   MRN: [id number] Patient ID: Teresa Mccullough, female   DOB: August 19, 1941, 74 y.o.   MRN: [id number] Patient ID: Teresa Mccullough, female   DOB: 03-17-1941, 74 y.o.   MRN: [id number] Patient ID: Teresa Mccullough, female   DOB: March 19, 1941, 74 y.o.   MRN: [id number] Patient ID: Teresa Mccullough, female   DOB: [birth year], 74 y.o.   MRN: [id number] Patient ID: Teresa Mccullough, female   DOB: December 03, 1940, 74 y.o.   MRN: [id number]  Psychiatric Assessment Adult  Patient Identification:  Teresa Mccullough Date of Evaluation:  05/08/2015 Chief Complaint: I'm  a little down History of Chief Complaint:   Chief Complaint  Patient presents with  . Depression  . Anxiety  . Follow-up    Depression        Past medical history includes anxiety.   Anxiety Symptoms include nervous/anxious behavior.     this patient is a 74 year old widowed white female who lives alone in [place]. Her husband died in 2007-01-31. 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  The patient was referred by Dr. Dimas Agu[facility] of [facility] for treatment of depression.  The patient states that she's had anxiety since her children were born in her 51s. She developed depression in her 76s She's never had suicidal ideation or psychiatric hospitalizations or psychotic symptoms. She's always been treated by her family physician. She's had ups and downs of her life but she's been particularly depressed since her husband died in Jan 31, 2007 of colon cancer.  Her family Dr. as had her on a combination of Paxil 30 mg, Abilify 2 mg , and Xanax 1 mg up to 4 times a day. She was doing fairly well up until it 2 or 3 months ago. She was cutting the Abilify in half and it wasn't working so she increased it to 2 mg again. Still wasn't working and then her drugstore switched her  to a generic brand. Even while going back to the brand name it has not helped. Currently she feels sad all the time and has crying spells. She feels jittery clammy and has hot flashes when she wakes up. She has dreams about her husband and misses him terribly. She takes an over-the-counter sleeping pill. She has no energy. She is very strong in her faith and spends time going to church and visiting friends and family but she has to really push herself. She has never been suicidal. She has lost her appetite and has lost 25 pounds since January. At times she has had high energy spells at last for several days and she cleans incessantly. However at this point she is very depressed  The patient returns after 6 weeks. She states that her depression and anxiety are better since I increased the Paxil and Valium. However her back as been hurting a lot and the physician in the ER gave her 10 hydrocodone. I warned her about combining this with Valium and told her to be very careful. She is sleeping fairly well and her mood is improved. She is walking with a cane today and seems to be having more difficulty. I asked if she feels safe living alone because she falls easily but her son lives right behind her.  Review of Systems  Constitutional: Positive for activity  change and unexpected weight change.  HENT: Negative.   Eyes: Negative.   Respiratory: Negative.   Cardiovascular: Negative.   Gastrointestinal: Positive for constipation.  Endocrine: Negative.   Genitourinary: Negative.   Musculoskeletal: Positive for back pain.  Allergic/Immunologic: Negative.   Neurological: Negative.   Hematological: Negative.   Psychiatric/Behavioral: Positive for depression and dysphoric mood. The patient is nervous/anxious.    Physical Exam not done  Depressive Symptoms: depressed mood, anhedonia, psychomotor retardation, fatigue, difficulty concentrating, panic attacks,  (Hypo) Manic Symptoms:   Elevated Mood:   No Irritable Mood:  No Grandiosity:  No Distractibility:  No Labiality of Mood:  Yes Delusions:  No Hallucinations:  No Impulsivity:  No Sexually Inappropriate Behavior:  No Financial Extravagance:  No Flight of Ideas:  No  Anxiety Symptoms: Excessive Worry:  Yes Panic Symptoms:  Yes Agoraphobia:  No Obsessive Compulsive: No  Symptoms: None, Specific Phobias:  No Social Anxiety:  No  Psychotic Symptoms:  Hallucinations: No None Delusions:  No Paranoia:  No   Ideas of Reference:  No  PTSD Symptoms: Ever had a traumatic exposure:  No Had a traumatic exposure in the last month:  No Re-experiencing: No None Hypervigilance:  No Hyperarousal: No None Avoidance: No None  Traumatic Brain Injury: No   Past Psychiatric History: Diagnosis: Maj. depression   Hospitalizations: None   Outpatient Care: Has seen mental health providers but doesn't remember any names   Substance Abuse Care: none  Self-Mutilation: none  Suicidal Attempts: none  Violent Behaviors: none   Past Medical History:   Past Medical History  Diagnosis Date  . Thyroid disease   . Diabetes mellitus, type II   . Constipation   . GERD (gastroesophageal reflux disease)   . Back pain   . Depression   . Anxiety    History of Loss of Consciousness:  No Seizure History:  No Cardiac History:  No Allergies:   Allergies  Allergen Reactions  . Cyclobenzaprine     hallucinations  . Ultram [Tramadol]     hallucinations   Current Medications:  Current Outpatient Prescriptions  Medication Sig Dispense Refill  . ARIPiprazole (ABILIFY) 2 MG tablet Take 1 tablet (2 mg total) by mouth daily. 30 tablet 2  . diazepam (VALIUM) 10 MG tablet Take 1 tablet (10 mg total) by mouth 2 (two) times daily. 60 tablet 2  . HYDROcodone-acetaminophen (NORCO/VICODIN) 5-325 MG per tablet Take 1 tablet by mouth as needed.     Marland Kitchen levothyroxine (SYNTHROID, LEVOTHROID) 75 MCG tablet Take 75 mcg by mouth daily before breakfast.     . metFORMIN (GLUCOPHAGE-XR) 500 MG 24 hr tablet Take 500 mg by mouth daily.    Marland Kitchen omeprazole (PRILOSEC) 20 MG capsule Take 20 mg by mouth daily.    Marland Kitchen PARoxetine (PAXIL) 40 MG tablet Take 1 tablet (40 mg total) by mouth daily. 30 tablet 2  . mirtazapine (REMERON) 30 MG tablet Take 1 tablet (30 mg total) by mouth at bedtime. 30 tablet 2   No current facility-administered medications for this visit.    Previous Psychotropic Medications:  Medication Dose                          Substance Abuse History in the last 12 months: Substance Age of 1st Use Last Use Amount Specific Type  Nicotine      Alcohol      Cannabis      Opiates      Cocaine  Methamphetamines      LSD      Ecstasy      Benzodiazepines      Caffeine      Inhalants      Others:                          Medical Consequences of Substance Abuse: n/a  Legal Consequences of Substance Abuse: n/a  Family Consequences of Substance Abuse: n/a  Blackouts:  No DT's:  No Withdrawal Symptoms:  No None  Social History: Current Place of Residence: [place] Birth: [place] Family Members: 3 sisters, several grandchildren and great-grandchildren, 3 children Marital Status:  Widowed Children: 3    Education:  GED Educational Problems/Performance:  Religious Beliefs/Practices: Christian History of Abuse: none Armed forces technical officer; housewife Hotel manager History:  None. Legal History: none Hobbies/Interests: Visiting family, cleaning  Family History:   Family History  Problem Relation Age of Onset  . Depression Sister   . Alcohol abuse Brother   . Alcohol abuse Son   . Alcohol abuse Father     Mental Status Examination/Evaluation: Objective:  Appearance: Casual, Neat and Well Groomed walking slowly with a cane   Eye Contact::  Good  Speech:  Slow  Volume:  Decreased  Mood: Fairly good   Affect:  Brighter   Thought Process:  Circumstantial  Orientation:  Full  (Time, Place, and Person)  Thought Content:  Rumination  Suicidal Thoughts:  No  Homicidal Thoughts:  No  Judgement:  Fair  Insight:  Fair  Psychomotor Activity:  Decreased  Akathisia:  No  Handed:  Right  AIMS (if indicated):    Assets:  Communication Skills Desire for Improvement Social Support    Laboratory/X-Ray Psychological Evaluation(s)        Assessment:  Axis I: Major Depression, Recurrent severe  AXIS I Major Depression, Recurrent severe  AXIS II Deferred  AXIS III Past Medical History  Diagnosis Date  . Thyroid disease   . Diabetes mellitus, type II   . Constipation   . GERD (gastroesophageal reflux disease)   . Back pain   . Depression   . Anxiety      AXIS IV other psychosocial or environmental problems  AXIS V 41-50 serious symptoms   Treatment Plan/Recommendations:  Plan of Care: Medication management   Laboratory:  Psychotherapy: She declines counseling due to the cost   Medications: She'll continue Paxil to 40 mg per day for depression. She'll continue  diazepam10 mg twice a day for anxiety. She continues to get her Abilify from [place] and I explained that I can't participate in this. Continue mirtazapine 30 mg at bedtime for sleep   Routine PRN Medications:  No  Consultations:   Safety Concerns:  She denies thoughts of self-harm   Other: She will return in 2 months     Teresa Ruder, MD 9/2/20161:55 PM

## 2015-05-11 DIAGNOSIS — R0902 Hypoxemia: Secondary | ICD-10-CM | POA: Diagnosis not present

## 2015-05-12 DIAGNOSIS — M4806 Spinal stenosis, lumbar region: Secondary | ICD-10-CM | POA: Diagnosis not present

## 2015-05-12 DIAGNOSIS — M4316 Spondylolisthesis, lumbar region: Secondary | ICD-10-CM | POA: Diagnosis not present

## 2015-05-12 DIAGNOSIS — M5136 Other intervertebral disc degeneration, lumbar region: Secondary | ICD-10-CM | POA: Diagnosis not present

## 2015-05-12 DIAGNOSIS — M545 Low back pain: Secondary | ICD-10-CM | POA: Diagnosis not present

## 2015-05-18 DIAGNOSIS — K219 Gastro-esophageal reflux disease without esophagitis: Secondary | ICD-10-CM | POA: Diagnosis not present

## 2015-05-18 DIAGNOSIS — M4806 Spinal stenosis, lumbar region: Secondary | ICD-10-CM | POA: Diagnosis not present

## 2015-05-18 DIAGNOSIS — R262 Difficulty in walking, not elsewhere classified: Secondary | ICD-10-CM | POA: Diagnosis not present

## 2015-05-18 DIAGNOSIS — R0781 Pleurodynia: Secondary | ICD-10-CM | POA: Diagnosis not present

## 2015-05-18 DIAGNOSIS — E119 Type 2 diabetes mellitus without complications: Secondary | ICD-10-CM | POA: Diagnosis not present

## 2015-05-18 DIAGNOSIS — I358 Other nonrheumatic aortic valve disorders: Secondary | ICD-10-CM | POA: Diagnosis not present

## 2015-05-18 DIAGNOSIS — Z79899 Other long term (current) drug therapy: Secondary | ICD-10-CM | POA: Diagnosis not present

## 2015-05-18 DIAGNOSIS — S0093XA Contusion of unspecified part of head, initial encounter: Secondary | ICD-10-CM | POA: Diagnosis not present

## 2015-05-18 DIAGNOSIS — S2231XA Fracture of one rib, right side, initial encounter for closed fracture: Secondary | ICD-10-CM | POA: Diagnosis not present

## 2015-05-18 DIAGNOSIS — I517 Cardiomegaly: Secondary | ICD-10-CM | POA: Diagnosis not present

## 2015-05-18 DIAGNOSIS — F329 Major depressive disorder, single episode, unspecified: Secondary | ICD-10-CM | POA: Diagnosis not present

## 2015-05-18 DIAGNOSIS — M5136 Other intervertebral disc degeneration, lumbar region: Secondary | ICD-10-CM | POA: Diagnosis not present

## 2015-05-18 DIAGNOSIS — S0100XA Unspecified open wound of scalp, initial encounter: Secondary | ICD-10-CM | POA: Diagnosis not present

## 2015-05-18 DIAGNOSIS — S2241XA Multiple fractures of ribs, right side, initial encounter for closed fracture: Secondary | ICD-10-CM | POA: Diagnosis not present

## 2015-05-18 DIAGNOSIS — S2243XA Multiple fractures of ribs, bilateral, initial encounter for closed fracture: Secondary | ICD-10-CM | POA: Diagnosis not present

## 2015-05-18 DIAGNOSIS — W1800XA Striking against unspecified object with subsequent fall, initial encounter: Secondary | ICD-10-CM | POA: Diagnosis not present

## 2015-05-18 DIAGNOSIS — R55 Syncope and collapse: Secondary | ICD-10-CM | POA: Diagnosis not present

## 2015-05-18 DIAGNOSIS — F419 Anxiety disorder, unspecified: Secondary | ICD-10-CM | POA: Diagnosis not present

## 2015-05-18 DIAGNOSIS — Z9181 History of falling: Secondary | ICD-10-CM | POA: Diagnosis not present

## 2015-05-18 DIAGNOSIS — S0101XA Laceration without foreign body of scalp, initial encounter: Secondary | ICD-10-CM | POA: Diagnosis not present

## 2015-05-18 DIAGNOSIS — Z79891 Long term (current) use of opiate analgesic: Secondary | ICD-10-CM | POA: Diagnosis not present

## 2015-05-18 DIAGNOSIS — S0191XA Laceration without foreign body of unspecified part of head, initial encounter: Secondary | ICD-10-CM | POA: Diagnosis not present

## 2015-05-18 DIAGNOSIS — S2232XA Fracture of one rib, left side, initial encounter for closed fracture: Secondary | ICD-10-CM | POA: Diagnosis not present

## 2015-05-19 DIAGNOSIS — S2243XA Multiple fractures of ribs, bilateral, initial encounter for closed fracture: Secondary | ICD-10-CM | POA: Diagnosis not present

## 2015-05-19 DIAGNOSIS — S0101XA Laceration without foreign body of scalp, initial encounter: Secondary | ICD-10-CM | POA: Diagnosis not present

## 2015-05-19 DIAGNOSIS — Z9181 History of falling: Secondary | ICD-10-CM | POA: Diagnosis not present

## 2015-05-19 DIAGNOSIS — R262 Difficulty in walking, not elsewhere classified: Secondary | ICD-10-CM | POA: Diagnosis not present

## 2015-05-20 DIAGNOSIS — R262 Difficulty in walking, not elsewhere classified: Secondary | ICD-10-CM | POA: Diagnosis not present

## 2015-05-20 DIAGNOSIS — S2243XA Multiple fractures of ribs, bilateral, initial encounter for closed fracture: Secondary | ICD-10-CM | POA: Diagnosis not present

## 2015-05-20 DIAGNOSIS — Z9181 History of falling: Secondary | ICD-10-CM | POA: Diagnosis not present

## 2015-05-22 DIAGNOSIS — E119 Type 2 diabetes mellitus without complications: Secondary | ICD-10-CM | POA: Diagnosis not present

## 2015-05-22 DIAGNOSIS — R531 Weakness: Secondary | ICD-10-CM | POA: Diagnosis not present

## 2015-05-22 DIAGNOSIS — K219 Gastro-esophageal reflux disease without esophagitis: Secondary | ICD-10-CM | POA: Diagnosis not present

## 2015-05-22 DIAGNOSIS — M4806 Spinal stenosis, lumbar region: Secondary | ICD-10-CM | POA: Diagnosis not present

## 2015-05-25 DIAGNOSIS — K219 Gastro-esophageal reflux disease without esophagitis: Secondary | ICD-10-CM | POA: Diagnosis not present

## 2015-05-25 DIAGNOSIS — R531 Weakness: Secondary | ICD-10-CM | POA: Diagnosis not present

## 2015-05-25 DIAGNOSIS — M4806 Spinal stenosis, lumbar region: Secondary | ICD-10-CM | POA: Diagnosis not present

## 2015-05-25 DIAGNOSIS — E119 Type 2 diabetes mellitus without complications: Secondary | ICD-10-CM | POA: Diagnosis not present

## 2015-05-26 DIAGNOSIS — E119 Type 2 diabetes mellitus without complications: Secondary | ICD-10-CM | POA: Diagnosis not present

## 2015-05-26 DIAGNOSIS — R531 Weakness: Secondary | ICD-10-CM | POA: Diagnosis not present

## 2015-05-26 DIAGNOSIS — K219 Gastro-esophageal reflux disease without esophagitis: Secondary | ICD-10-CM | POA: Diagnosis not present

## 2015-05-26 DIAGNOSIS — M4806 Spinal stenosis, lumbar region: Secondary | ICD-10-CM | POA: Diagnosis not present

## 2015-05-28 DIAGNOSIS — K219 Gastro-esophageal reflux disease without esophagitis: Secondary | ICD-10-CM | POA: Diagnosis not present

## 2015-05-28 DIAGNOSIS — E119 Type 2 diabetes mellitus without complications: Secondary | ICD-10-CM | POA: Diagnosis not present

## 2015-05-28 DIAGNOSIS — M5136 Other intervertebral disc degeneration, lumbar region: Secondary | ICD-10-CM | POA: Diagnosis not present

## 2015-05-28 DIAGNOSIS — Z6828 Body mass index (BMI) 28.0-28.9, adult: Secondary | ICD-10-CM | POA: Diagnosis not present

## 2015-05-28 DIAGNOSIS — M4316 Spondylolisthesis, lumbar region: Secondary | ICD-10-CM | POA: Diagnosis not present

## 2015-05-28 DIAGNOSIS — M545 Low back pain: Secondary | ICD-10-CM | POA: Diagnosis not present

## 2015-05-28 DIAGNOSIS — R531 Weakness: Secondary | ICD-10-CM | POA: Diagnosis not present

## 2015-05-28 DIAGNOSIS — M4806 Spinal stenosis, lumbar region: Secondary | ICD-10-CM | POA: Diagnosis not present

## 2015-05-29 DIAGNOSIS — M4806 Spinal stenosis, lumbar region: Secondary | ICD-10-CM | POA: Diagnosis not present

## 2015-05-29 DIAGNOSIS — K219 Gastro-esophageal reflux disease without esophagitis: Secondary | ICD-10-CM | POA: Diagnosis not present

## 2015-05-29 DIAGNOSIS — E119 Type 2 diabetes mellitus without complications: Secondary | ICD-10-CM | POA: Diagnosis not present

## 2015-05-29 DIAGNOSIS — R531 Weakness: Secondary | ICD-10-CM | POA: Diagnosis not present

## 2015-05-31 DIAGNOSIS — Z87891 Personal history of nicotine dependence: Secondary | ICD-10-CM | POA: Diagnosis not present

## 2015-05-31 DIAGNOSIS — Z79899 Other long term (current) drug therapy: Secondary | ICD-10-CM | POA: Diagnosis not present

## 2015-05-31 DIAGNOSIS — S0101XA Laceration without foreign body of scalp, initial encounter: Secondary | ICD-10-CM | POA: Diagnosis not present

## 2015-05-31 DIAGNOSIS — S0990XA Unspecified injury of head, initial encounter: Secondary | ICD-10-CM | POA: Diagnosis not present

## 2015-05-31 DIAGNOSIS — K219 Gastro-esophageal reflux disease without esophagitis: Secondary | ICD-10-CM | POA: Diagnosis not present

## 2015-05-31 DIAGNOSIS — M542 Cervicalgia: Secondary | ICD-10-CM | POA: Diagnosis not present

## 2015-05-31 DIAGNOSIS — E039 Hypothyroidism, unspecified: Secondary | ICD-10-CM | POA: Diagnosis not present

## 2015-05-31 DIAGNOSIS — E119 Type 2 diabetes mellitus without complications: Secondary | ICD-10-CM | POA: Diagnosis not present

## 2015-05-31 DIAGNOSIS — R0781 Pleurodynia: Secondary | ICD-10-CM | POA: Diagnosis not present

## 2015-05-31 DIAGNOSIS — W0110XA Fall on same level from slipping, tripping and stumbling with subsequent striking against unspecified object, initial encounter: Secondary | ICD-10-CM | POA: Diagnosis not present

## 2015-05-31 DIAGNOSIS — R51 Headache: Secondary | ICD-10-CM | POA: Diagnosis not present

## 2015-06-01 DIAGNOSIS — R531 Weakness: Secondary | ICD-10-CM | POA: Diagnosis not present

## 2015-06-01 DIAGNOSIS — K2990 Gastroduodenitis, unspecified, without bleeding: Secondary | ICD-10-CM | POA: Diagnosis not present

## 2015-06-01 DIAGNOSIS — M4806 Spinal stenosis, lumbar region: Secondary | ICD-10-CM | POA: Diagnosis not present

## 2015-06-01 DIAGNOSIS — E119 Type 2 diabetes mellitus without complications: Secondary | ICD-10-CM | POA: Diagnosis not present

## 2015-06-03 DIAGNOSIS — M1990 Unspecified osteoarthritis, unspecified site: Secondary | ICD-10-CM | POA: Diagnosis not present

## 2015-06-03 DIAGNOSIS — F419 Anxiety disorder, unspecified: Secondary | ICD-10-CM | POA: Diagnosis not present

## 2015-06-03 DIAGNOSIS — M6281 Muscle weakness (generalized): Secondary | ICD-10-CM | POA: Diagnosis not present

## 2015-06-03 DIAGNOSIS — M4806 Spinal stenosis, lumbar region: Secondary | ICD-10-CM | POA: Diagnosis not present

## 2015-06-03 DIAGNOSIS — E119 Type 2 diabetes mellitus without complications: Secondary | ICD-10-CM | POA: Diagnosis not present

## 2015-06-03 DIAGNOSIS — M545 Low back pain: Secondary | ICD-10-CM | POA: Diagnosis not present

## 2015-06-03 DIAGNOSIS — R0902 Hypoxemia: Secondary | ICD-10-CM | POA: Diagnosis not present

## 2015-06-03 DIAGNOSIS — R270 Ataxia, unspecified: Secondary | ICD-10-CM | POA: Diagnosis not present

## 2015-06-03 DIAGNOSIS — F338 Other recurrent depressive disorders: Secondary | ICD-10-CM | POA: Diagnosis not present

## 2015-06-03 DIAGNOSIS — E039 Hypothyroidism, unspecified: Secondary | ICD-10-CM | POA: Diagnosis not present

## 2015-06-03 DIAGNOSIS — K219 Gastro-esophageal reflux disease without esophagitis: Secondary | ICD-10-CM | POA: Diagnosis not present

## 2015-06-03 DIAGNOSIS — Z9181 History of falling: Secondary | ICD-10-CM | POA: Diagnosis not present

## 2015-06-06 DIAGNOSIS — R270 Ataxia, unspecified: Secondary | ICD-10-CM | POA: Diagnosis not present

## 2015-06-19 DIAGNOSIS — M6281 Muscle weakness (generalized): Secondary | ICD-10-CM | POA: Diagnosis not present

## 2015-06-19 DIAGNOSIS — M4806 Spinal stenosis, lumbar region: Secondary | ICD-10-CM | POA: Diagnosis not present

## 2015-06-19 DIAGNOSIS — F338 Other recurrent depressive disorders: Secondary | ICD-10-CM | POA: Diagnosis not present

## 2015-06-19 DIAGNOSIS — R270 Ataxia, unspecified: Secondary | ICD-10-CM | POA: Diagnosis not present

## 2015-06-19 DIAGNOSIS — E039 Hypothyroidism, unspecified: Secondary | ICD-10-CM | POA: Diagnosis not present

## 2015-06-19 DIAGNOSIS — M1990 Unspecified osteoarthritis, unspecified site: Secondary | ICD-10-CM | POA: Diagnosis not present

## 2015-06-19 DIAGNOSIS — R296 Repeated falls: Secondary | ICD-10-CM | POA: Diagnosis not present

## 2015-06-19 DIAGNOSIS — E119 Type 2 diabetes mellitus without complications: Secondary | ICD-10-CM | POA: Diagnosis not present

## 2015-06-22 DIAGNOSIS — M1990 Unspecified osteoarthritis, unspecified site: Secondary | ICD-10-CM | POA: Diagnosis not present

## 2015-06-22 DIAGNOSIS — R270 Ataxia, unspecified: Secondary | ICD-10-CM | POA: Diagnosis not present

## 2015-06-22 DIAGNOSIS — M6281 Muscle weakness (generalized): Secondary | ICD-10-CM | POA: Diagnosis not present

## 2015-06-22 DIAGNOSIS — E039 Hypothyroidism, unspecified: Secondary | ICD-10-CM | POA: Diagnosis not present

## 2015-06-22 DIAGNOSIS — F338 Other recurrent depressive disorders: Secondary | ICD-10-CM | POA: Diagnosis not present

## 2015-06-22 DIAGNOSIS — E119 Type 2 diabetes mellitus without complications: Secondary | ICD-10-CM | POA: Diagnosis not present

## 2015-06-22 DIAGNOSIS — M4806 Spinal stenosis, lumbar region: Secondary | ICD-10-CM | POA: Diagnosis not present

## 2015-06-22 DIAGNOSIS — R296 Repeated falls: Secondary | ICD-10-CM | POA: Diagnosis not present

## 2015-06-23 DIAGNOSIS — R270 Ataxia, unspecified: Secondary | ICD-10-CM | POA: Diagnosis not present

## 2015-06-23 DIAGNOSIS — R296 Repeated falls: Secondary | ICD-10-CM | POA: Diagnosis not present

## 2015-06-23 DIAGNOSIS — M4806 Spinal stenosis, lumbar region: Secondary | ICD-10-CM | POA: Diagnosis not present

## 2015-06-23 DIAGNOSIS — E119 Type 2 diabetes mellitus without complications: Secondary | ICD-10-CM | POA: Diagnosis not present

## 2015-06-23 DIAGNOSIS — E039 Hypothyroidism, unspecified: Secondary | ICD-10-CM | POA: Diagnosis not present

## 2015-06-23 DIAGNOSIS — M1990 Unspecified osteoarthritis, unspecified site: Secondary | ICD-10-CM | POA: Diagnosis not present

## 2015-06-23 DIAGNOSIS — F338 Other recurrent depressive disorders: Secondary | ICD-10-CM | POA: Diagnosis not present

## 2015-06-23 DIAGNOSIS — M6281 Muscle weakness (generalized): Secondary | ICD-10-CM | POA: Diagnosis not present

## 2015-06-25 DIAGNOSIS — E119 Type 2 diabetes mellitus without complications: Secondary | ICD-10-CM | POA: Diagnosis not present

## 2015-06-25 DIAGNOSIS — E039 Hypothyroidism, unspecified: Secondary | ICD-10-CM | POA: Diagnosis not present

## 2015-06-25 DIAGNOSIS — M6281 Muscle weakness (generalized): Secondary | ICD-10-CM | POA: Diagnosis not present

## 2015-06-25 DIAGNOSIS — F338 Other recurrent depressive disorders: Secondary | ICD-10-CM | POA: Diagnosis not present

## 2015-06-25 DIAGNOSIS — M4806 Spinal stenosis, lumbar region: Secondary | ICD-10-CM | POA: Diagnosis not present

## 2015-06-25 DIAGNOSIS — R296 Repeated falls: Secondary | ICD-10-CM | POA: Diagnosis not present

## 2015-06-25 DIAGNOSIS — R270 Ataxia, unspecified: Secondary | ICD-10-CM | POA: Diagnosis not present

## 2015-06-25 DIAGNOSIS — M1990 Unspecified osteoarthritis, unspecified site: Secondary | ICD-10-CM | POA: Diagnosis not present

## 2015-06-26 DIAGNOSIS — R296 Repeated falls: Secondary | ICD-10-CM | POA: Diagnosis not present

## 2015-06-26 DIAGNOSIS — F338 Other recurrent depressive disorders: Secondary | ICD-10-CM | POA: Diagnosis not present

## 2015-06-26 DIAGNOSIS — M1990 Unspecified osteoarthritis, unspecified site: Secondary | ICD-10-CM | POA: Diagnosis not present

## 2015-06-26 DIAGNOSIS — M4806 Spinal stenosis, lumbar region: Secondary | ICD-10-CM | POA: Diagnosis not present

## 2015-06-26 DIAGNOSIS — E039 Hypothyroidism, unspecified: Secondary | ICD-10-CM | POA: Diagnosis not present

## 2015-06-26 DIAGNOSIS — R270 Ataxia, unspecified: Secondary | ICD-10-CM | POA: Diagnosis not present

## 2015-06-26 DIAGNOSIS — E119 Type 2 diabetes mellitus without complications: Secondary | ICD-10-CM | POA: Diagnosis not present

## 2015-06-26 DIAGNOSIS — M6281 Muscle weakness (generalized): Secondary | ICD-10-CM | POA: Diagnosis not present

## 2015-06-29 DIAGNOSIS — E039 Hypothyroidism, unspecified: Secondary | ICD-10-CM | POA: Diagnosis not present

## 2015-06-29 DIAGNOSIS — R270 Ataxia, unspecified: Secondary | ICD-10-CM | POA: Diagnosis not present

## 2015-06-29 DIAGNOSIS — M6281 Muscle weakness (generalized): Secondary | ICD-10-CM | POA: Diagnosis not present

## 2015-06-29 DIAGNOSIS — E119 Type 2 diabetes mellitus without complications: Secondary | ICD-10-CM | POA: Diagnosis not present

## 2015-06-29 DIAGNOSIS — M4806 Spinal stenosis, lumbar region: Secondary | ICD-10-CM | POA: Diagnosis not present

## 2015-06-29 DIAGNOSIS — F338 Other recurrent depressive disorders: Secondary | ICD-10-CM | POA: Diagnosis not present

## 2015-06-29 DIAGNOSIS — M1990 Unspecified osteoarthritis, unspecified site: Secondary | ICD-10-CM | POA: Diagnosis not present

## 2015-06-29 DIAGNOSIS — R296 Repeated falls: Secondary | ICD-10-CM | POA: Diagnosis not present

## 2015-06-30 DIAGNOSIS — E119 Type 2 diabetes mellitus without complications: Secondary | ICD-10-CM | POA: Diagnosis not present

## 2015-06-30 DIAGNOSIS — R270 Ataxia, unspecified: Secondary | ICD-10-CM | POA: Diagnosis not present

## 2015-06-30 DIAGNOSIS — F338 Other recurrent depressive disorders: Secondary | ICD-10-CM | POA: Diagnosis not present

## 2015-06-30 DIAGNOSIS — M6281 Muscle weakness (generalized): Secondary | ICD-10-CM | POA: Diagnosis not present

## 2015-06-30 DIAGNOSIS — R296 Repeated falls: Secondary | ICD-10-CM | POA: Diagnosis not present

## 2015-06-30 DIAGNOSIS — M4806 Spinal stenosis, lumbar region: Secondary | ICD-10-CM | POA: Diagnosis not present

## 2015-06-30 DIAGNOSIS — M1990 Unspecified osteoarthritis, unspecified site: Secondary | ICD-10-CM | POA: Diagnosis not present

## 2015-06-30 DIAGNOSIS — E039 Hypothyroidism, unspecified: Secondary | ICD-10-CM | POA: Diagnosis not present

## 2015-07-01 DIAGNOSIS — F338 Other recurrent depressive disorders: Secondary | ICD-10-CM | POA: Diagnosis not present

## 2015-07-01 DIAGNOSIS — M6281 Muscle weakness (generalized): Secondary | ICD-10-CM | POA: Diagnosis not present

## 2015-07-01 DIAGNOSIS — E119 Type 2 diabetes mellitus without complications: Secondary | ICD-10-CM | POA: Diagnosis not present

## 2015-07-01 DIAGNOSIS — R270 Ataxia, unspecified: Secondary | ICD-10-CM | POA: Diagnosis not present

## 2015-07-01 DIAGNOSIS — E039 Hypothyroidism, unspecified: Secondary | ICD-10-CM | POA: Diagnosis not present

## 2015-07-01 DIAGNOSIS — R296 Repeated falls: Secondary | ICD-10-CM | POA: Diagnosis not present

## 2015-07-01 DIAGNOSIS — M4806 Spinal stenosis, lumbar region: Secondary | ICD-10-CM | POA: Diagnosis not present

## 2015-07-01 DIAGNOSIS — M1990 Unspecified osteoarthritis, unspecified site: Secondary | ICD-10-CM | POA: Diagnosis not present

## 2015-07-07 ENCOUNTER — Ambulatory Visit (INDEPENDENT_AMBULATORY_CARE_PROVIDER_SITE_OTHER): Payer: Medicare Other | Admitting: Psychiatry

## 2015-07-07 ENCOUNTER — Encounter (HOSPITAL_COMMUNITY): Payer: Self-pay | Admitting: Psychiatry

## 2015-07-07 VITALS — BP 117/78 | HR 72 | Ht 64.0 in | Wt 159.4 lb

## 2015-07-07 DIAGNOSIS — F331 Major depressive disorder, recurrent, moderate: Secondary | ICD-10-CM | POA: Diagnosis not present

## 2015-07-07 MED ORDER — DIAZEPAM 10 MG PO TABS: 10.0000 mg | ORAL_TABLET | Freq: Two times a day (BID) | ORAL | Status: DC

## 2015-07-07 MED ORDER — MIRTAZAPINE 30 MG PO TABS: 30.0000 mg | ORAL_TABLET | Freq: Every day | ORAL | Status: DC

## 2015-07-07 MED ORDER — PAROXETINE HCL 40 MG PO TABS: 40.0000 mg | ORAL_TABLET | Freq: Every day | ORAL | Status: DC

## 2015-07-07 NOTE — Progress Notes (Signed)
Patient ID: TYRESHA FEDE, female   DOB: [birth year], 74 y.o.   MRN: [id number] Patient ID: CHRISTYN GUTKOWSKI, female   DOB: [birth year], 74 y.o.   MRN: [id number] Patient ID: DENIYA CRAIGO, female   DOB: Oct 21, 1940, 74 y.o.   MRN: [id number] Patient ID: KATJA BLUE, female   DOB: 1941-07-04, 74 y.o.   MRN: [id number] Patient ID: MAKALYN LENNOX, female   DOB: [birth year], 74 y.o.   MRN: [id number] Patient ID: ADALYNNE STEFFENSMEIER, female   DOB: 1940-11-01, 74 y.o.   MRN: [id number] Patient ID: DONELLA PASCARELLA, female   DOB: Oct 27, 1940, 74 y.o.   MRN: [id number] Patient ID: MEKAYLA SOMAN, female   DOB: 07/24/1941, 74 y.o.   MRN: [id number]  Psychiatric Assessment Adult  Patient Identification:  GAYLYN BERISH Date of Evaluation:  07/07/2015 Chief Complaint: I'm  a little down History of Chief Complaint:   Chief Complaint  Patient presents with  . Depression  . Anxiety  . Follow-up    Depression        Past medical history includes anxiety.   Anxiety Symptoms include nervous/anxious behavior.     this patient is a 74 year old widowed white female who lives alone in [place]. Her husband died in 01-Feb-2007. 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  The patient was referred by Dr. Dimas Aguas of [facility] for treatment of depression.  The patient states that she's had anxiety since her children were born in her 66s. She developed depression in her 30s. She's never had suicidal ideation or psychiatric hospitalizations or psychotic symptoms. She's always been treated by her family physician. She's had ups and downs of her life but she's been particularly depressed since her husband died in 02-01-2007 of colon cancer.  Her family Dr. as had her on a combination of Paxil 30 mg, Abilify 2 mg , and Xanax 1 mg up to 4 times a day. She was doing fairly well up until it 2 or 3 months ago. She was cutting the Abilify in half and it wasn't working so she  increased it to 2 mg again. Still wasn't working and then her drugstore switched her to a generic brand. Even while going back to the brand name it has not helped. Currently she feels sad all the time and has crying spells. She feels jittery clammy and has hot flashes when she wakes up. She has dreams about her husband and misses him terribly. She takes an over-the-counter sleeping pill. She has no energy. She is very strong in her faith and spends time going to church and visiting friends and family but she has to really push herself. She has never been suicidal. She has lost her appetite and has lost 25 pounds since January. At times she has had high energy spells at last for several days and she cleans incessantly. However at this point she is very depressed  The patient returns after 2 months. She is here with her granddaughter [name]. [name] and her family have moved in to live with the patient. The patient states that she's felt more down lately and has no energy to do much of anything. She used to go to the [facility] to exercise and she stopped doing this. She mostly watches TV. We discussed increasing her Abilify but she gets it from [place] and I'm not sure how she is going to increase it. I suggested going up from 5 mg daily  to 7.5 or even 10. If this does not work we will need to switch to another drug such as Rexulti. She was falling quite a bit for a while and she recently had physical therapy which has helped and she is no longer falling Review of Systems  Constitutional: Positive for activity change and unexpected weight change.  HENT: Negative.   Eyes: Negative.   Respiratory: Negative.   Cardiovascular: Negative.   Gastrointestinal: Positive for constipation.  Endocrine: Negative.   Genitourinary: Negative.   Musculoskeletal: Positive for back pain.  Allergic/Immunologic: Negative.   Neurological: Negative.   Hematological: Negative.   Psychiatric/Behavioral: Positive for depression  and dysphoric mood. The patient is nervous/anxious.    Physical Exam not done  Depressive Symptoms: depressed mood, anhedonia, psychomotor retardation, fatigue, difficulty concentrating, panic attacks,  (Hypo) Manic Symptoms:   Elevated Mood:  No Irritable Mood:  No Grandiosity:  No Distractibility:  No Labiality of Mood:  Yes Delusions:  No Hallucinations:  No Impulsivity:  No Sexually Inappropriate Behavior:  No Financial Extravagance:  No Flight of Ideas:  No  Anxiety Symptoms: Excessive Worry:  Yes Panic Symptoms:  Yes Agoraphobia:  No Obsessive Compulsive: No  Symptoms: None, Specific Phobias:  No Social Anxiety:  No  Psychotic Symptoms:  Hallucinations: No None Delusions:  No Paranoia:  No   Ideas of Reference:  No  PTSD Symptoms: Ever had a traumatic exposure:  No Had a traumatic exposure in the last month:  No Re-experiencing: No None Hypervigilance:  No Hyperarousal: No None Avoidance: No None  Traumatic Brain Injury: No   Past Psychiatric History: Diagnosis: Maj. depression   Hospitalizations: None   Outpatient Care: Has seen mental health providers but doesn't remember any names   Substance Abuse Care: none  Self-Mutilation: none  Suicidal Attempts: none  Violent Behaviors: none   Past Medical History:   Past Medical History  Diagnosis Date  . Thyroid disease   . Diabetes mellitus, type II (HCC)   . Constipation   . GERD (gastroesophageal reflux disease)   . Back pain   . Depression   . Anxiety    History of Loss of Consciousness:  No Seizure History:  No Cardiac History:  No Allergies:   Allergies  Allergen Reactions  . Cyclobenzaprine     hallucinations  . Ultram [Tramadol]     hallucinations   Current Medications:  Current Outpatient Prescriptions  Medication Sig Dispense Refill  . Acetaminophen (TYLENOL ARTHRITIS PAIN PO) Take by mouth as needed.    . Acetaminophen (TYLENOL EXTRA STRENGTH PO) Take by mouth as needed.     . ARIPiprazole (ABILIFY) 2 MG tablet Take 1 tablet (2 mg total) by mouth daily. 30 tablet 2  . diazepam (VALIUM) 10 MG tablet Take 1 tablet (10 mg total) by mouth 2 (two) times daily. 60 tablet 2  . HYDROcodone-acetaminophen (NORCO/VICODIN) 5-325 MG per tablet Take 1 tablet by mouth as needed.     Marland Kitchen levothyroxine (SYNTHROID, LEVOTHROID) 75 MCG tablet Take 75 mcg by mouth daily before breakfast.    . metFORMIN (GLUCOPHAGE-XR) 500 MG 24 hr tablet Take 500 mg by mouth daily.    . mirtazapine (REMERON) 30 MG tablet Take 1 tablet (30 mg total) by mouth at bedtime. 30 tablet 2  . omeprazole (PRILOSEC) 20 MG capsule Take 20 mg by mouth daily.    Marland Kitchen PARoxetine (PAXIL) 40 MG tablet Take 1 tablet (40 mg total) by mouth daily. 30 tablet 2   No current facility-administered  medications for this visit.    Previous Psychotropic Medications:  Medication Dose                          Substance Abuse History in the last 12 months: Substance Age of 1st Use Last Use Amount Specific Type  Nicotine      Alcohol      Cannabis      Opiates      Cocaine      Methamphetamines      LSD      Ecstasy      Benzodiazepines      Caffeine      Inhalants      Others:                          Medical Consequences of Substance Abuse: n/a  Legal Consequences of Substance Abuse: n/a  Family Consequences of Substance Abuse: n/a  Blackouts:  No DT's:  No Withdrawal Symptoms:  No None  Social History: Current Place of Residence: [place]  [place] Family Members: 3 sisters, several grandchildren and great-grandchildren, 3 children Marital Status:  Widowed Children: 3    Education:  GED Educational Problems/Performance:  Religious Beliefs/Practices: Christian History of Abuse: none Armed forces technical officerccupational Experiences; housewife Hotel managerMilitary History:  None. Legal History: none Hobbies/Interests: Visiting family, cleaning  Family History:   Family History  Problem  Relation Age of Onset  . Depression Sister   . Alcohol abuse Brother   . Alcohol abuse Son   . Alcohol abuse Father     Mental Status Examination/Evaluation: Objective:  Appearance: Casual, Neat and Well Groomed walking much better on her own today without a cane   Eye Contact::  Good  Speech:  Slow  Volume:  Decreased  Mood: depressed  Affect:  Dysphoric   Thought Process:  Circumstantial  Orientation:  Full (Time, Place, and Person)  Thought Content:  Rumination  Suicidal Thoughts:  No  Homicidal Thoughts:  No  Judgement:  Fair  Insight:  Fair  Psychomotor Activity:  Decreased  Akathisia:  No  Handed:  Right  AIMS (if indicated):    Assets:  Communication Skills Desire for Improvement Social Support    Laboratory/X-Ray Psychological Evaluation(s)        Assessment:  Axis I: Major Depression, Recurrent severe  AXIS I Major Depression, Recurrent severe  AXIS II Deferred  AXIS III Past Medical History  Diagnosis Date  . Thyroid disease   . Diabetes mellitus, type II (HCC)   . Constipation   . GERD (gastroesophageal reflux disease)   . Back pain   . Depression   . Anxiety      AXIS IV other psychosocial or environmental problems  AXIS V 41-50 serious symptoms   Treatment Plan/Recommendations:  Plan of Care: Medication management   Laboratory:  Psychotherapy: She declines counseling due to the cost   Medications: She'll continue Paxil to 40 mg per day for depression. She'll continue  diazepam10 mg twice a day for anxiety. She continues to get her [place] from [place] and I explained that I can't participate in this but I encouraged her to increase the dosage to 7.5 or 10 mg daily. Her granddaughter voiced understanding of this Continue mirtazapine 30 mg at bedtime for sleep   Routine PRN Medications:  No  Consultations:   Safety Concerns:  She denies thoughts of self-harm   Other: She will return  in 4 weeks     Diannia Ruder, MD 11/1/20161:26 PM

## 2015-07-08 ENCOUNTER — Telehealth (HOSPITAL_COMMUNITY): Payer: Self-pay | Admitting: *Deleted

## 2015-07-08 NOTE — Telephone Encounter (Signed)
Called pt and informed her of what Dr. Tenny Crawoss stated in her chart about the Abilify and pt showed understanding.

## 2015-07-08 NOTE — Telephone Encounter (Signed)
ABILIFY ONLY COME IN 10 MG.  SHE CAN'T TAKE 7.5 MG.

## 2015-07-10 DIAGNOSIS — E039 Hypothyroidism, unspecified: Secondary | ICD-10-CM | POA: Diagnosis not present

## 2015-07-11 DIAGNOSIS — R0902 Hypoxemia: Secondary | ICD-10-CM | POA: Diagnosis not present

## 2015-08-10 DIAGNOSIS — R0902 Hypoxemia: Secondary | ICD-10-CM | POA: Diagnosis not present

## 2015-08-17 ENCOUNTER — Ambulatory Visit (HOSPITAL_COMMUNITY): Payer: Self-pay | Admitting: Psychiatry

## 2015-09-01 DIAGNOSIS — E039 Hypothyroidism, unspecified: Secondary | ICD-10-CM | POA: Diagnosis not present

## 2015-09-01 DIAGNOSIS — I10 Essential (primary) hypertension: Secondary | ICD-10-CM | POA: Diagnosis not present

## 2015-09-01 DIAGNOSIS — E119 Type 2 diabetes mellitus without complications: Secondary | ICD-10-CM | POA: Diagnosis not present

## 2015-09-01 DIAGNOSIS — E7800 Pure hypercholesterolemia, unspecified: Secondary | ICD-10-CM | POA: Diagnosis not present

## 2015-09-01 DIAGNOSIS — K219 Gastro-esophageal reflux disease without esophagitis: Secondary | ICD-10-CM | POA: Diagnosis not present

## 2015-09-04 ENCOUNTER — Ambulatory Visit (HOSPITAL_COMMUNITY): Payer: Self-pay | Admitting: Psychiatry

## 2015-09-07 DIAGNOSIS — H353222 Exudative age-related macular degeneration, left eye, with inactive choroidal neovascularization: Secondary | ICD-10-CM | POA: Diagnosis not present

## 2015-09-07 DIAGNOSIS — H25813 Combined forms of age-related cataract, bilateral: Secondary | ICD-10-CM | POA: Diagnosis not present

## 2015-09-07 DIAGNOSIS — H353111 Nonexudative age-related macular degeneration, right eye, early dry stage: Secondary | ICD-10-CM | POA: Diagnosis not present

## 2015-09-08 DIAGNOSIS — H25811 Combined forms of age-related cataract, right eye: Secondary | ICD-10-CM | POA: Diagnosis not present

## 2015-09-23 DIAGNOSIS — Z13820 Encounter for screening for osteoporosis: Secondary | ICD-10-CM | POA: Diagnosis not present

## 2015-09-23 DIAGNOSIS — M899 Disorder of bone, unspecified: Secondary | ICD-10-CM | POA: Diagnosis not present

## 2015-09-25 NOTE — Patient Instructions (Signed)
Your procedure is scheduled on:   10/01/2015              Report to [facility] at   830  AM.  Call this number if you have problems the morning of surgery: [phone]   Remember:   Do not eat or drink :After Midnight.    Take these medicines the morning of surgery with A SIP OF WATER:      Paxil, Omeprazole, Levothyroxine, valium and abilify      Do not wear jewelry, make-up or nail polish.  Do not wear lotions, powders, or perfumes. You may wear deodorant.  Do not shave 48 hours prior to surgery.  Do not bring valuables to the hospital.  Contacts, dentures or bridgework may not be worn into surgery.  Patients discharged the day of surgery will not be allowed to drive home.  Name and phone number of your driver:    @ Cataract Surgery  A cataract is a clouding of the lens of the eye. When a lens becomes cloudy, vision is reduced based on the degree and nature of the clouding. Surgery may be needed to improve vision. Surgery removes the cloudy lens and usually replaces it with a substitute lens (intraocular lens, IOL). LET YOUR EYE DOCTOR KNOW ABOUT:  Allergies to food or medicine.   Medicines taken including herbs, eyedrops, over-the-counter medicines, and creams.   Use of steroids (by mouth or creams).   Previous problems with anesthetics or numbing medicine.   History of bleeding problems or blood clots.   Previous surgery.   Other health problems, including diabetes and kidney problems.   Possibility of pregnancy, if this applies.  RISKS AND COMPLICATIONS  Infection.   Inflammation of the eyeball (endophthalmitis) that can spread to both eyes (sympathetic ophthalmia).   Poor wound healing.   If an IOL is inserted, it can later fall out of proper position. This is very uncommon.   Clouding of the part of your eye that holds an IOL in place. This is called an "after-cataract." These are uncommon, but easily treated.  BEFORE THE PROCEDURE  Do not  eat or drink anything except small amounts of water for 8 to 12 before your surgery, or as directed by your caregiver.   Unless you are told otherwise, continue any eyedrops you have been prescribed.   Talk to your primary caregiver about all other medicines that you take (both prescription and non-prescription). In some cases, you may need to stop or change medicines near the time of your surgery. This is most important if you are taking blood-thinning medicine.Do not stop medicines unless you are told to do so.   Arrange for someone to drive you to and from the procedure.   Do not put contact lenses in either eye on the day of your surgery.  PROCEDURE There is more than one method for safely removing a cataract. Your doctor can explain the differences and help determine which is best for you. Phacoemulsification surgery is the most common form of cataract surgery.  An injection is given behind the eye or eyedrops are given to make this a painless procedure.   A small cut (incision) is made on the edge of the clear, dome-shaped surface that covers the front of the eye (cornea).   A tiny probe is painlessly inserted into the eye. This device gives off ultrasound waves that soften and break up the cloudy center of the lens. This makes it easier for the  cloudy lens to be removed by suction.   An IOL may be implanted.   The normal lens of the eye is covered by a clear capsule. Part of that capsule is intentionally left in the eye to support the IOL.   Your surgeon may or may not use stitches to close the incision.  There are other forms of cataract surgery that require a larger incision and stiches to close the eye. This approach is taken in cases where the doctor feels that the cataract cannot be easily removed using phacoemulsification. AFTER THE PROCEDURE  When an IOL is implanted, it does not need care. It becomes a permanent part of your eye and cannot be seen or felt.   Your doctor  will schedule follow-up exams to check on your progress.   Review your other medicines with your doctor to see which can be resumed after surgery.   Use eyedrops or take medicine as prescribed by your doctor.  Document Released: 08/11/2011 Document Reviewed: 08/08/2011 [facility] Patient Information 2012 [place].  .Cataract Surgery Care After Refer to this sheet in the next few weeks. These instructions provide you with information on caring for yourself after your procedure. Your caregiver may also give you more specific instructions. Your treatment has been planned according to current medical practices, but problems sometimes occur. Call your caregiver if you have any problems or questions after your procedure.  HOME CARE INSTRUCTIONS   Avoid strenuous activities as directed by your caregiver.   Ask your caregiver when you can resume driving.   Use eyedrops or other medicines to help healing and control pressure inside your eye as directed by your caregiver.   Only take over-the-counter or prescription medicines for pain, discomfort, or fever as directed by your caregiver.   Do not to touch or rub your eyes.   You may be instructed to use a protective shield during the first few days and nights after surgery. If not, wear sunglasses to protect your eyes. This is to protect the eye from pressure or from being accidentally bumped.   Keep the area around your eye clean and dry. Avoid swimming or allowing water to hit you directly in the face while showering. Keep soap and shampoo out of your eyes.   Do not bend or lift heavy objects. Bending increases pressure in the eye. You can walk, climb stairs, and do light household chores.   Do not put a contact lens into the eye that had surgery until your caregiver says it is okay to do so.   Ask your doctor when you can return to work. This will depend on the kind of work that you do. If you work in a dusty environment, you may be  advised to wear protective eyewear for a period of time.   Ask your caregiver when it will be safe to engage in sexual activity.   Continue with your regular eye exams as directed by your caregiver.  What to expect:  It is normal to feel itching and mild discomfort for a few days after cataract surgery. Some fluid discharge is also common, and your eye may be sensitive to light and touch.   After 1 to 2 days, even moderate discomfort should disappear. In most cases, healing will take about 6 weeks.   If you received an intraocular lens (IOL), you may notice that colors are very bright or have a blue tinge. Also, if you have been in bright sunlight, everything may appear reddish  for a few hours. If you see these color tinges, it is because your lens is clear and no longer cloudy. Within a few months after receiving an IOL, these extra colors should go away. When you have healed, you will probably need new glasses.  SEEK MEDICAL CARE IF:   You have increased bruising around your eye.   You have discomfort not helped by medicine.  SEEK IMMEDIATE MEDICAL CARE IF:   You have a fever.   You have a worsening or sudden vision loss.   You have redness, swelling, or increasing pain in the eye.   You have a thick discharge from the eye that had surgery.  MAKE SURE YOU:  Understand these instructions.   Will watch your condition.   Will get help right away if you are not doing well or get worse.  Document Released: 03/11/2005 Document Revised: 08/11/2011 Document Reviewed: 04/15/2011 [facility] Patient Information 2012 [place].    Monitored Anesthesia Care  Monitored anesthesia care is an anesthesia service for a medical procedure. Anesthesia is the loss of the ability to feel pain. It is produced by medications called anesthetics. It may affect a small area of your body (local anesthesia), a large area of your body (regional anesthesia), or your entire body (general anesthesia). The  need for monitored anesthesia care depends your procedure, your condition, and the potential need for regional or general anesthesia. It is often provided during procedures where:   General anesthesia may be needed if there are complications. This is because you need special care when you are under general anesthesia.   You will be under local or regional anesthesia. This is so that you are able to have higher levels of anesthesia if needed.   You will receive calming medications (sedatives). This is especially the case if sedatives are given to put you in a semi-conscious state of relaxation (deep sedation). This is because the amount of sedative needed to produce this state can be hard to predict. Too much of a sedative can produce general anesthesia. Monitored anesthesia care is performed by one or more caregivers who have special training in all types of anesthesia. You will need to meet with these caregivers before your procedure. During this meeting, they will ask you about your medical history. They will also give you instructions to follow. (For example, you will need to stop eating and drinking before your procedure. You may also need to stop or change medications you are taking.) During your procedure, your caregivers will stay with you. They will:   Watch your condition. This includes watching you blood pressure, breathing, and level of pain.   Diagnose and treat problems that occur.   Give medications if they are needed. These may include calming medications (sedatives) and anesthetics.   Make sure you are comfortable.  Having monitored anesthesia care does not necessarily mean that you will be under anesthesia. It does mean that your caregivers will be able to manage anesthesia if you need it or if it occurs. It also means that you will be able to have a different type of anesthesia than you are having if you need it. When your procedure is complete, your caregivers will continue  to watch your condition. They will make sure any medications wear off before you are allowed to go home.  Document Released: 05/18/2005 Document Revised: 12/17/2012 Document Reviewed: 10/03/2012 [facility] Patient Information 2014 [place], [place].

## 2015-09-26 DIAGNOSIS — M545 Low back pain: Secondary | ICD-10-CM | POA: Diagnosis not present

## 2015-09-26 DIAGNOSIS — Z0001 Encounter for general adult medical examination with abnormal findings: Secondary | ICD-10-CM | POA: Diagnosis not present

## 2015-09-26 DIAGNOSIS — E1165 Type 2 diabetes mellitus with hyperglycemia: Secondary | ICD-10-CM | POA: Diagnosis not present

## 2015-09-28 ENCOUNTER — Other Ambulatory Visit: Payer: Self-pay

## 2015-09-28 ENCOUNTER — Encounter (HOSPITAL_COMMUNITY)
Admission: RE | Admit: 2015-09-28 | Discharge: 2015-09-28 | Disposition: A | Discharge status: Home / Self Care | Payer: Medicare Other | Source: Ambulatory Visit | Attending: Ophthalmology | Admitting: Ophthalmology

## 2015-09-28 ENCOUNTER — Encounter (HOSPITAL_COMMUNITY): Payer: Self-pay

## 2015-09-28 DIAGNOSIS — F419 Anxiety disorder, unspecified: Secondary | ICD-10-CM | POA: Diagnosis not present

## 2015-09-28 DIAGNOSIS — Z7984 Long term (current) use of oral hypoglycemic drugs: Secondary | ICD-10-CM | POA: Diagnosis not present

## 2015-09-28 DIAGNOSIS — K219 Gastro-esophageal reflux disease without esophagitis: Secondary | ICD-10-CM | POA: Diagnosis not present

## 2015-09-28 DIAGNOSIS — Z79899 Other long term (current) drug therapy: Secondary | ICD-10-CM | POA: Diagnosis not present

## 2015-09-28 DIAGNOSIS — E119 Type 2 diabetes mellitus without complications: Secondary | ICD-10-CM | POA: Diagnosis not present

## 2015-09-28 DIAGNOSIS — H25811 Combined forms of age-related cataract, right eye: Secondary | ICD-10-CM | POA: Diagnosis not present

## 2015-09-28 DIAGNOSIS — F329 Major depressive disorder, single episode, unspecified: Secondary | ICD-10-CM | POA: Diagnosis not present

## 2015-09-28 DIAGNOSIS — E039 Hypothyroidism, unspecified: Secondary | ICD-10-CM | POA: Diagnosis not present

## 2015-09-28 LAB — CBC
HEMATOCRIT: 42.1 % (ref 36.0–46.0)
HEMOGLOBIN: 13.7 g/dL (ref 12.0–15.0)
MCH: 30 pg (ref 26.0–34.0)
MCHC: 32.5 g/dL (ref 30.0–36.0)
MCV: 92.1 fL (ref 78.0–100.0)
Platelets: 219 10*3/uL (ref 150–400)
RBC: 4.57 MIL/uL (ref 3.87–5.11)
RDW: 15 % (ref 11.5–15.5)
WBC: 7.2 10*3/uL (ref 4.0–10.5)

## 2015-09-28 LAB — BASIC METABOLIC PANEL
ANION GAP: 9 (ref 5–15)
BUN: 24 mg/dL — ABNORMAL HIGH (ref 6–20)
CALCIUM: 9.9 mg/dL (ref 8.9–10.3)
CHLORIDE: 101 mmol/L (ref 101–111)
CO2: 31 mmol/L (ref 22–32)
Creatinine, Ser: 0.86 mg/dL (ref 0.44–1.00)
GFR calc non Af Amer: >60 mL/min (ref >60)
GLUCOSE: 110 mg/dL — AB (ref 65–99)
POTASSIUM: 4.8 mmol/L (ref 3.5–5.1)
Sodium: 141 mmol/L (ref 135–145)

## 2015-09-30 ENCOUNTER — Ambulatory Visit (INDEPENDENT_AMBULATORY_CARE_PROVIDER_SITE_OTHER): Payer: Medicare Other | Admitting: Psychiatry

## 2015-09-30 ENCOUNTER — Encounter (HOSPITAL_COMMUNITY): Payer: Self-pay | Admitting: Psychiatry

## 2015-09-30 VITALS — BP 99/63 | HR 69 | Ht 64.0 in | Wt 166.2 lb

## 2015-09-30 DIAGNOSIS — F331 Major depressive disorder, recurrent, moderate: Secondary | ICD-10-CM | POA: Diagnosis not present

## 2015-09-30 MED ORDER — CYCLOPENTOLATE-PHENYLEPHRINE OP SOLN OPTIME - NO CHARGE
OPHTHALMIC | Status: AC
  Filled 2015-09-30: qty 2

## 2015-09-30 MED ORDER — TETRACAINE HCL 0.5 % OP SOLN
OPHTHALMIC | Status: AC
  Filled 2015-09-30: qty 4

## 2015-09-30 MED ORDER — DIAZEPAM 10 MG PO TABS: 10.0000 mg | ORAL_TABLET | Freq: Two times a day (BID) | ORAL | Status: DC

## 2015-09-30 MED ORDER — PAROXETINE HCL 40 MG PO TABS: 40.0000 mg | ORAL_TABLET | Freq: Every day | ORAL | Status: DC

## 2015-09-30 MED ORDER — LIDOCAINE HCL (PF) 1 % IJ SOLN
INTRAMUSCULAR | Status: AC
  Filled 2015-09-30: qty 2

## 2015-09-30 MED ORDER — PHENYLEPHRINE HCL 2.5 % OP SOLN
OPHTHALMIC | Status: AC
  Filled 2015-09-30: qty 15

## 2015-09-30 MED ORDER — LIDOCAINE HCL 3.5 % OP GEL
OPHTHALMIC | Status: AC
  Filled 2015-09-30: qty 1

## 2015-09-30 MED ORDER — NEOMYCIN-POLYMYXIN-DEXAMETH 3.5-10000-0.1 OP SUSP
OPHTHALMIC | Status: AC
  Filled 2015-09-30: qty 5

## 2015-09-30 MED ORDER — MIRTAZAPINE 30 MG PO TABS: 30.0000 mg | ORAL_TABLET | Freq: Every day | ORAL | Status: DC

## 2015-09-30 NOTE — Progress Notes (Signed)
Patient ID: Teresa Mccullough, female   DOB: Jun 29, 1941, 75 y.o.   MRN: [id number] Patient ID: Teresa Mccullough, female   DOB: 02/12/1941, 75 y.o.   MRN: [id number] Patient ID: Teresa Mccullough, female   DOB: 1941-04-19, 75 y.o.   MRN: [id number] Patient ID: Teresa Mccullough, female   DOB: 1941-01-11, 75 y.o.   MRN: [id number] Patient ID: Teresa Mccullough, female   DOB: 1940-12-01, 75 y.o.   MRN: [id number] Patient ID: Teresa Mccullough, female   DOB: 09/27/1940, 75 y.o.   MRN: [id number] Patient ID: Teresa Mccullough, female   DOB: 1941/06/23, 75 y.o.   MRN: [id number] Patient ID: Teresa Mccullough, female   DOB: September 30, 1940, 75 y.o.   MRN: [id number] Patient ID: Teresa Mccullough, female   DOB: 01-11-1941, 75 y.o.   MRN: [id number]  Psychiatric Assessment Adult  Patient Identification:  Teresa Mccullough Date of Evaluation:  09/30/2015 Chief Complaint: I'm  a little down History of Chief Complaint:   Chief Complaint  Patient presents with  . Depression  . Anxiety  . Follow-up    Depression        Past medical history includes anxiety.   Anxiety Symptoms include nervous/anxious behavior.     this patient is a 75 year old widowed white female who lives alone in [place]. Her husband died in 2007/02/01. 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  The patient was referred by Dr. Dimas Aguas of [facility] for treatment of depression.  The patient states that she's had anxiety since her children were born in her 19s. She developed depression in her 30s. She's never had suicidal ideation or psychiatric hospitalizations or psychotic symptoms. She's always been treated by her family physician. She's had ups and downs of her life but she's been particularly depressed since her husband died in 02-01-2007 of colon cancer.  Her family Dr. as had her on a combination of Paxil 30 mg, Abilify 2 mg , and Xanax 1 mg up to 4 times a day. She was doing fairly well up until it 2 or 3  months ago. She was cutting the Abilify in half and it wasn't working so she increased it to 2 mg again. Still wasn't working and then her drugstore switched her to a generic brand. Even while going back to the brand name it has not helped. Currently she feels sad all the time and has crying spells. She feels jittery clammy and has hot flashes when she wakes up. She has dreams about her husband and misses him terribly. She takes an over-the-counter sleeping pill. She has no energy. She is very strong in her faith and spends time going to church and visiting friends and family but she has to really push herself. She has never been suicidal. She has lost her appetite and has lost 25 pounds since January. At times she has had high energy spells at last for several days and she cleans incessantly. However at this point she is very depressed  The patient returns after 3 months. She is here with her granddaughter [name]. [name] and her family have moved in to live with the patient that they are going to be moving out this coming weekend. The patient is also undergoing cataract replacement surgery later this week and she is worried about both of these events. She seems a bit blunted and depressed today but brightened up once we started talking. Both she and [name]  think her medicines are working fairly well but she doesn't have the get up and go that she use to. Her primary doctors talking to her about perhaps living in an assisted care facility but she wants to stay at home. Her family is committed to helping her. She is on a higher dose of Abilify-10 mg and she seems to think it is helping a little bit. She still orders [place] from the [place] pharmacy Review of Systems  Constitutional: Positive for activity change and unexpected weight change.  HENT: Negative.   Eyes: Negative.   Respiratory: Negative.   Cardiovascular: Negative.   Gastrointestinal: Positive for constipation.  Endocrine: Negative.    Genitourinary: Negative.   Musculoskeletal: Positive for back pain.  Allergic/Immunologic: Negative.   Neurological: Negative.   Hematological: Negative.   Psychiatric/Behavioral: Positive for depression and dysphoric mood. The patient is nervous/anxious.    Physical Exam not done  Depressive Symptoms: depressed mood, anhedonia, psychomotor retardation, fatigue, difficulty concentrating, panic attacks,  (Hypo) Manic Symptoms:   Elevated Mood:  No Irritable Mood:  No Grandiosity:  No Distractibility:  No Labiality of Mood:  Yes Delusions:  No Hallucinations:  No Impulsivity:  No Sexually Inappropriate Behavior:  No Financial Extravagance:  No Flight of Ideas:  No  Anxiety Symptoms: Excessive Worry:  Yes Panic Symptoms:  Yes Agoraphobia:  No Obsessive Compulsive: No  Symptoms: None, Specific Phobias:  No Social Anxiety:  No  Psychotic Symptoms:  Hallucinations: No None Delusions:  No Paranoia:  No   Ideas of Reference:  No  PTSD Symptoms: Ever had a traumatic exposure:  No Had a traumatic exposure in the last month:  No Re-experiencing: No None Hypervigilance:  No Hyperarousal: No None Avoidance: No None  Traumatic Brain Injury: No   Past Psychiatric History: Diagnosis: Maj. depression   Hospitalizations: None   Outpatient Care: Has seen mental health providers but doesn't remember any names   Substance Abuse Care: none  Self-Mutilation: none  Suicidal Attempts: none  Violent Behaviors: none   Past Medical History:   Past Medical History  Diagnosis Date  . Thyroid disease   . Diabetes mellitus, type II (HCC)   . Constipation   . GERD (gastroesophageal reflux disease)   . Back pain   . Depression   . Anxiety    History of Loss of Consciousness:  No Seizure History:  No Cardiac History:  No Allergies:   Allergies  Allergen Reactions  . Cyclobenzaprine     hallucinations  . Ultram [Tramadol]     hallucinations   Current Medications:   Current Outpatient Prescriptions  Medication Sig Dispense Refill  . acetaminophen (TYLENOL) 500 MG tablet Take 500 mg by mouth every 6 (six) hours as needed for mild pain.    . ARIPiprazole (ABILIFY) 10 MG tablet Take 10 mg by mouth daily.    . diazepam (VALIUM) 10 MG tablet Take 1 tablet (10 mg total) by mouth 2 (two) times daily. 60 tablet 2  . levothyroxine (SYNTHROID, LEVOTHROID) 75 MCG tablet Take 75 mcg by mouth daily before breakfast.    . metFORMIN (GLUCOPHAGE-XR) 500 MG 24 hr tablet Take 500 mg by mouth daily.    . mirtazapine (REMERON) 30 MG tablet Take 1 tablet (30 mg total) by mouth at bedtime. 30 tablet 2  . omeprazole (PRILOSEC) 20 MG capsule Take 20 mg by mouth daily.    Marland Kitchen PARoxetine (PAXIL) 40 MG tablet Take 1 tablet (40 mg total) by mouth daily. 30 tablet 2  No current facility-administered medications for this visit.    Previous Psychotropic Medications:  Medication Dose                          Substance Abuse History in the last 12 months: Substance Age of 1st Use Last Use Amount Specific Type  Nicotine      Alcohol      Cannabis      Opiates      Cocaine      Methamphetamines      LSD      Ecstasy      Benzodiazepines      Caffeine      Inhalants      Others:                          Medical Consequences of Substance Abuse: n/a  Legal Consequences of Substance Abuse: n/a  Family Consequences of Substance Abuse: n/a  Blackouts:  No DT's:  No Withdrawal Symptoms:  No None  Social History: Current Place of Residence: [place] of Birth: [place] Family Members: 3 sisters, several grandchildren and great-grandchildren, 3 children Marital Status:  Widowed Children: 3    Education:  GED Educational Problems/Performance:  Religious Beliefs/Practices: Christian History of Abuse: none Armed forces technical officer; housewife Hotel manager History:  None. Legal History: none Hobbies/Interests: Visiting family,  cleaning  Family History:   Family History  Problem Relation Age of Onset  . Depression Sister   . Alcohol abuse Brother   . Alcohol abuse Son   . Alcohol abuse Father     Mental Status Examination/Evaluation: Objective:  Appearance: Casual, Neat and Well Groomed walking much better on her own today   Eye Contact::  Good  Speech:  Slow  Volume:  Decreased  Mood: depressed initially but then improved   Affect:  A little blunted   Thought Process:  Circumstantial  Orientation:  Full (Time, Place, and Person)  Thought Content:  Rumination  Suicidal Thoughts:  No  Homicidal Thoughts:  No  Judgement:  Fair  Insight:  Fair  Psychomotor Activity:  Decreased  Akathisia:  No  Handed:  Right  AIMS (if indicated):    Assets:  Communication Skills Desire for Improvement Social Support    Laboratory/X-Ray Psychological Evaluation(s)        Assessment:  Axis I: Major Depression, Recurrent severe  AXIS I Major Depression, Recurrent severe  AXIS II Deferred  AXIS III Past Medical History  Diagnosis Date  . Thyroid disease   . Diabetes mellitus, type II (HCC)   . Constipation   . GERD (gastroesophageal reflux disease)   . Back pain   . Depression   . Anxiety      AXIS IV other psychosocial or environmental problems  AXIS V 41-50 serious symptoms   Treatment Plan/Recommendations:  Plan of Care: Medication management   Laboratory:  Psychotherapy: She declines counseling due to the cost   Medications: She'll continue Paxil to 40 mg per day for depression. She'll continue  diazepam10 mg twice a day for anxiety. She continues to get her Abilify from [place] and I explained that I can't participate in this. Her granddaughter voiced understanding of this Continue mirtazapine 30 mg at bedtime for sleep   Routine PRN Medications:  No  Consultations:   Safety Concerns:  She denies thoughts of self-harm   Other: She will return in  2 months  Diannia Ruder, MD 1/25/201710:00  AM

## 2015-10-01 ENCOUNTER — Ambulatory Visit (HOSPITAL_COMMUNITY)
Admission: RE | Admit: 2015-10-01 | Discharge: 2015-10-01 | Disposition: A | Discharge status: Home / Self Care | Payer: Medicare Other | Source: Ambulatory Visit | Attending: Ophthalmology | Admitting: Ophthalmology

## 2015-10-01 ENCOUNTER — Encounter (HOSPITAL_COMMUNITY)
Admission: RE | Disposition: A | Discharge status: Home / Self Care | Payer: Self-pay | Source: Ambulatory Visit | Attending: Ophthalmology

## 2015-10-01 ENCOUNTER — Ambulatory Visit (HOSPITAL_COMMUNITY): Payer: Medicare Other | Admitting: Anesthesiology

## 2015-10-01 ENCOUNTER — Encounter (HOSPITAL_COMMUNITY): Payer: Self-pay | Admitting: *Deleted

## 2015-10-01 DIAGNOSIS — E039 Hypothyroidism, unspecified: Secondary | ICD-10-CM | POA: Insufficient documentation

## 2015-10-01 DIAGNOSIS — Z79899 Other long term (current) drug therapy: Secondary | ICD-10-CM | POA: Diagnosis not present

## 2015-10-01 DIAGNOSIS — E119 Type 2 diabetes mellitus without complications: Secondary | ICD-10-CM | POA: Diagnosis not present

## 2015-10-01 DIAGNOSIS — F419 Anxiety disorder, unspecified: Secondary | ICD-10-CM | POA: Insufficient documentation

## 2015-10-01 DIAGNOSIS — H25811 Combined forms of age-related cataract, right eye: Secondary | ICD-10-CM | POA: Insufficient documentation

## 2015-10-01 DIAGNOSIS — K219 Gastro-esophageal reflux disease without esophagitis: Secondary | ICD-10-CM | POA: Insufficient documentation

## 2015-10-01 DIAGNOSIS — H269 Unspecified cataract: Secondary | ICD-10-CM | POA: Diagnosis not present

## 2015-10-01 DIAGNOSIS — H2511 Age-related nuclear cataract, right eye: Secondary | ICD-10-CM | POA: Diagnosis not present

## 2015-10-01 DIAGNOSIS — F329 Major depressive disorder, single episode, unspecified: Secondary | ICD-10-CM | POA: Diagnosis not present

## 2015-10-01 DIAGNOSIS — Z7984 Long term (current) use of oral hypoglycemic drugs: Secondary | ICD-10-CM | POA: Diagnosis not present

## 2015-10-01 HISTORY — PX: CATARACT EXTRACTION W/PHACO: SHX586

## 2015-10-01 LAB — GLUCOSE, CAPILLARY: GLUCOSE-CAPILLARY: 125 mg/dL — AB (ref 65–99)

## 2015-10-01 SURGERY — PHACOEMULSIFICATION, CATARACT, WITH IOL INSERTION
Anesthesia: Monitor Anesthesia Care | Site: Eye | Laterality: Right

## 2015-10-01 MED ORDER — POVIDONE-IODINE 5 % OP SOLN
OPHTHALMIC | Status: DC | PRN
  Administered 2015-10-01: 1 via OPHTHALMIC

## 2015-10-01 MED ORDER — PHENYLEPHRINE HCL 2.5 % OP SOLN
1.0000 [drp] | OPHTHALMIC | Status: AC
  Administered 2015-10-01 (×3): 1 [drp] via OPHTHALMIC

## 2015-10-01 MED ORDER — MIDAZOLAM HCL 5 MG/5ML IJ SOLN
INTRAMUSCULAR | Status: DC | PRN
  Administered 2015-10-01: 2 mg via INTRAVENOUS

## 2015-10-01 MED ORDER — PROVISC 10 MG/ML IO SOLN
INTRAOCULAR | Status: DC | PRN
  Administered 2015-10-01: 0.85 mL via INTRAOCULAR

## 2015-10-01 MED ORDER — LIDOCAINE HCL 3.5 % OP GEL
1.0000 "application " | Freq: Once | OPHTHALMIC | Status: AC
  Administered 2015-10-01: 1 via OPHTHALMIC

## 2015-10-01 MED ORDER — LIDOCAINE 3.5 % OP GEL OPTIME - NO CHARGE
OPHTHALMIC | Status: DC | PRN
  Administered 2015-10-01: 2 [drp] via OPHTHALMIC

## 2015-10-01 MED ORDER — BSS IO SOLN
INTRAOCULAR | Status: DC | PRN
  Administered 2015-10-01: 15 mL

## 2015-10-01 MED ORDER — LIDOCAINE HCL (PF) 1 % IJ SOLN
INTRAMUSCULAR | Status: DC | PRN
  Administered 2015-10-01: .6 mL

## 2015-10-01 MED ORDER — MIDAZOLAM HCL 2 MG/2ML IJ SOLN
1.0000 mg | INTRAMUSCULAR | Status: DC | PRN
  Administered 2015-10-01: 2 mg via INTRAVENOUS

## 2015-10-01 MED ORDER — EPINEPHRINE HCL 1 MG/ML IJ SOLN
INTRAMUSCULAR | Status: AC
  Filled 2015-10-01: qty 1

## 2015-10-01 MED ORDER — MIDAZOLAM HCL 2 MG/2ML IJ SOLN
INTRAMUSCULAR | Status: AC
  Filled 2015-10-01: qty 2

## 2015-10-01 MED ORDER — TETRACAINE HCL 0.5 % OP SOLN
1.0000 [drp] | OPHTHALMIC | Status: AC
  Administered 2015-10-01 (×3): 1 [drp] via OPHTHALMIC

## 2015-10-01 MED ORDER — CYCLOPENTOLATE-PHENYLEPHRINE 0.2-1 % OP SOLN
1.0000 [drp] | OPHTHALMIC | Status: AC
Start: 2015-10-01 — End: 2015-10-01
  Administered 2015-10-01 (×3): 1 [drp] via OPHTHALMIC

## 2015-10-01 MED ORDER — LACTATED RINGERS IV SOLN
INTRAVENOUS | Status: DC
  Administered 2015-10-01: 10:00:00 via INTRAVENOUS

## 2015-10-01 MED ORDER — NEOMYCIN-POLYMYXIN-DEXAMETH 3.5-10000-0.1 OP SUSP
OPHTHALMIC | Status: DC | PRN
Start: 2015-10-01 — End: 2015-10-01
  Administered 2015-10-01: 2 [drp] via OPHTHALMIC

## 2015-10-01 MED ORDER — EPINEPHRINE HCL 1 MG/ML IJ SOLN
INTRAOCULAR | Status: DC | PRN
  Administered 2015-10-01: 500 mL

## 2015-10-01 SURGICAL SUPPLY — 13 items
EYE SHIELD UNIVERSAL CLEAR (GAUZE/BANDAGES/DRESSINGS) ×3 IMPLANT
GLOVE BIO SURGEON STRL SZ 6.5 (GLOVE) ×2 IMPLANT
GLOVE BIO SURGEONS STRL SZ 6.5 (GLOVE) ×1
GLOVE BIOGEL PI IND STRL 7.0 (GLOVE) ×2 IMPLANT
GLOVE BIOGEL PI INDICATOR 7.0 (GLOVE) ×4
GLOVE EXAM NITRILE MD LF STRL (GLOVE) ×3 IMPLANT
GOWN STRL REUS W/TWL LRG LVL3 (GOWN DISPOSABLE) ×3 IMPLANT
PAD ARMBOARD 7.5X6 YLW CONV (MISCELLANEOUS) ×3 IMPLANT
SIGHTPATH CAT PROC W REG LENS (Ophthalmic Related) ×3 IMPLANT
SYRINGE LUER LOK 1CC (MISCELLANEOUS) ×3 IMPLANT
TAPE SURG TRANSPORE 1 IN (GAUZE/BANDAGES/DRESSINGS) ×1 IMPLANT
TAPE SURGICAL TRANSPORE 1 IN (GAUZE/BANDAGES/DRESSINGS) ×2
WATER STERILE IRR 250ML POUR (IV SOLUTION) ×3 IMPLANT

## 2015-10-01 NOTE — Discharge Instructions (Signed)
PATIENT INSTRUCTIONS °POST-ANESTHESIA ° °IMMEDIATELY FOLLOWING SURGERY:  Do not drive or operate machinery for the first twenty four hours after surgery.  Do not make any important decisions for twenty four hours after surgery or while taking narcotic pain medications or sedatives.  If you develop intractable nausea and vomiting or a severe headache please notify your doctor immediately. ° °FOLLOW-UP:  Please make an appointment with your surgeon as instructed. You do not need to follow up with anesthesia unless specifically instructed to do so. ° °WOUND CARE INSTRUCTIONS (if applicable):  Keep a dry clean dressing on the anesthesia/puncture wound site if there is drainage.  Once the wound has quit draining you may leave it open to air.  Generally you should leave the bandage intact for twenty four hours unless there is drainage.  If the epidural site drains for more than 36-48 hours please call the anesthesia department. ° °QUESTIONS?:  Please feel free to call your physician or the hospital operator if you have any questions, and they will be happy to assist you.    ° °Monitored Anesthesia Care °Monitored anesthesia care is an anesthesia service for a medical procedure. Anesthesia is the loss of the ability to feel pain. It is produced by medicines called anesthetics. It may affect a small area of your body (local anesthesia), a large area of your body (regional anesthesia), or your entire body (general anesthesia). The need for monitored anesthesia care depends your procedure, your condition, and the potential need for regional or general anesthesia. It is often provided during procedures where:  °· General anesthesia may be needed if there are complications. This is because you need special care when you are under general anesthesia.   °· You will be under local or regional anesthesia. This is so that you are able to have higher levels of anesthesia if needed.   °· You will receive calming medicines (sedatives).  This is especially the case if sedatives are given to put you in a semi-conscious state of relaxation (deep sedation). This is because the amount of sedative needed to produce this state can be hard to predict. Too much of a sedative can produce general anesthesia. °Monitored anesthesia care is performed by one or more health care providers who have special training in all types of anesthesia. You will need to meet with these health care providers before your procedure. During this meeting, they will ask you about your medical history. They will also give you instructions to follow. (For example, you will need to stop eating and drinking before your procedure. You may also need to stop or change medicines you are taking.) During your procedure, your health care providers will stay with you. They will:  °· Watch your condition. This includes watching your blood pressure, breathing, and level of pain.   °· Diagnose and treat problems that occur.   °· Give medicines if they are needed. These may include calming medicines (sedatives) and anesthetics.   °· Make sure you are comfortable.   °Having monitored anesthesia care does not necessarily mean that you will be under anesthesia. It does mean that your health care providers will be able to manage anesthesia if you need it or if it occurs. It also means that you will be able to have a different type of anesthesia than you are having if you need it. When your procedure is complete, your health care providers will continue to watch your condition. They will make sure any medicines wear off before you are allowed to go home.  °  °  This information is not intended to replace advice given to you by your health care provider. Make sure you discuss any questions you have with your health care provider. °  °Document Released: 05/18/2005 Document Revised: 09/12/2014 Document Reviewed: 10/03/2012 °Elsevier Interactive Patient Education ©2016 [employer] ° ° °

## 2015-10-01 NOTE — Anesthesia Procedure Notes (Signed)
Procedure Name: MAC Date/Time: 10/01/2015 9:55 AM Performed by: Pernell Dupre, Cree Napoli A Pre-anesthesia Checklist: Patient identified, Timeout performed, Emergency Drugs available, Suction available and Patient being monitored Oxygen Delivery Method: Nasal cannula

## 2015-10-01 NOTE — Anesthesia Preprocedure Evaluation (Signed)
Anesthesia Evaluation  Patient identified by MRN, date of birth, ID band Patient awake    Reviewed: Allergy & Precautions, NPO status , Patient's Chart, lab work & pertinent test results  Airway Mallampati: III  TM Distance: <3 FB Neck ROM: Full    Dental  (+) Edentulous Upper, Edentulous Lower, Upper Dentures, Lower Dentures   Pulmonary    Pulmonary exam normal        Cardiovascular Normal cardiovascular exam     Neuro/Psych Anxiety Depression    GI/Hepatic GERD  Medicated and Poorly Controlled,  Endo/Other  diabetes, Well Controlled, Type 2, Oral Hypoglycemic AgentsHypothyroidism   Renal/GU      Musculoskeletal   Abdominal Normal abdominal exam  (+)   Peds  Hematology   Anesthesia Other Findings   Reproductive/Obstetrics                             Anesthesia Physical Anesthesia Plan  ASA: III  Anesthesia Plan: MAC   Post-op Pain Management:    Induction: Intravenous  Airway Management Planned: Nasal Cannula  Additional Equipment:   Intra-op Plan:   Post-operative Plan:   Informed Consent: I have reviewed the patients History and Physical, chart, labs and discussed the procedure including the risks, benefits and alternatives for the proposed anesthesia with the patient or authorized representative who has indicated his/her understanding and acceptance.   Dental advisory given  Plan Discussed with: CRNA  Anesthesia Plan Comments:         Anesthesia Quick Evaluation

## 2015-10-01 NOTE — Op Note (Signed)
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  Assistants: None  Anesthesia: Topical with MAC  Indications: Painless, progressive loss of vision with compromise of daily activities.  Surgery: Cataract Extraction with Intraocular lens Implant Right Eye  Discription: The patient had dilating drops and viscous lidocaine placed into the Right eye in the pre-op holding area. After transfer to the operating room, a time out was performed. The patient was then prepped and draped. Beginning with a 75 degree blade a paracentesis port was made at the surgeon's 2 o'clock position. The anterior chamber was then filled with 1% non-preserved lidocaine. This was followed by filling the anterior chamber with Provisc.  A 2.63mm keratome blade was used to make a clear corneal incision at the temporal limbus.  A bent cystatome needle was used to create a continuous tear capsulotomy. Hydrodissection was performed with balanced salt solution on a Fine canula. The lens nucleus was then removed using the phacoemulsification handpiece. Residual cortex was removed with the I&A handpiece. The anterior chamber and capsular bag were refilled with Provisc. A posterior chamber intraocular lens was placed into the capsular bag with it's injector. The implant was positioned with the Kuglan hook. The Provisc was then removed from the anterior chamber and capsular bag with the I&A handpiece. Stromal hydration of the main incision and paracentesis port was performed with BSS on a Fine canula. The wounds were tested for leak which was negative. The patient tolerated the procedure well. There were no operative complications. The patient was then transferred to the recovery room in stable condition.  Complications: None  Specimen: None  EBL: None  Prosthetic device: Hoya iSert 250, power 21.5  D, SN [id number].

## 2015-10-01 NOTE — Anesthesia Postprocedure Evaluation (Signed)
Anesthesia Post Note  Patient: Teresa Mccullough  Procedure(s) Performed: Procedure(s) (LRB): CATARACT EXTRACTION PHACO AND INTRAOCULAR LENS PLACEMENT (IOC) (Right)  Patient location during evaluation: Short Stay Anesthesia Type: MAC Level of consciousness: awake and alert and oriented Pain management: pain level controlled Vital Signs Assessment: post-procedure vital signs reviewed and stable Respiratory status: respiratory function stable Cardiovascular status: stable Postop Assessment: no signs of nausea or vomiting Anesthetic complications: no    Last Vitals:  Filed Vitals:   10/01/15 0945 10/01/15 0950  BP: 121/68 117/62  Pulse:    Temp:    Resp: 19 17    Last Pain: There were no vitals filed for this visit.               [name] A

## 2015-10-01 NOTE — Transfer of Care (Signed)
Immediate Anesthesia Transfer of Care Note  Patient: Teresa Mccullough  Procedure(s) Performed: Procedure(s) with comments: CATARACT EXTRACTION PHACO AND INTRAOCULAR LENS PLACEMENT (IOC) (Right) - CDE:6.22  Patient Location: Short Stay  Anesthesia Type:MAC  Level of Consciousness: awake, alert , oriented and patient cooperative  Airway & Oxygen Therapy: Patient Spontanous Breathing  Post-op Assessment: Report given to RN and Post -op Vital signs reviewed and stable  Post vital signs: Reviewed and stable  Last Vitals:  Filed Vitals:   10/01/15 0945 10/01/15 0950  BP: 121/68 117/62  Pulse:    Temp:    Resp: 19 17    Complications: No apparent anesthesia complications

## 2015-10-01 NOTE — H&P (Signed)
I have reviewed the H&P, the patient was re-examined, and I have identified no interval changes in medical condition and plan of care since the history and physical of record  

## 2015-10-02 ENCOUNTER — Encounter (HOSPITAL_COMMUNITY): Payer: Self-pay | Admitting: Ophthalmology

## 2015-10-12 DIAGNOSIS — H04129 Dry eye syndrome of unspecified lacrimal gland: Secondary | ICD-10-CM | POA: Diagnosis not present

## 2015-11-06 DIAGNOSIS — K5901 Slow transit constipation: Secondary | ICD-10-CM | POA: Diagnosis not present

## 2015-11-11 DIAGNOSIS — Z79899 Other long term (current) drug therapy: Secondary | ICD-10-CM | POA: Diagnosis not present

## 2015-11-11 DIAGNOSIS — Z7984 Long term (current) use of oral hypoglycemic drugs: Secondary | ICD-10-CM | POA: Diagnosis not present

## 2015-11-11 DIAGNOSIS — E039 Hypothyroidism, unspecified: Secondary | ICD-10-CM | POA: Diagnosis not present

## 2015-11-11 DIAGNOSIS — K5900 Constipation, unspecified: Secondary | ICD-10-CM | POA: Diagnosis not present

## 2015-11-11 DIAGNOSIS — R55 Syncope and collapse: Secondary | ICD-10-CM | POA: Diagnosis not present

## 2015-11-11 DIAGNOSIS — Z0389 Encounter for observation for other suspected diseases and conditions ruled out: Secondary | ICD-10-CM | POA: Diagnosis not present

## 2015-11-11 DIAGNOSIS — K219 Gastro-esophageal reflux disease without esophagitis: Secondary | ICD-10-CM | POA: Diagnosis not present

## 2015-11-11 DIAGNOSIS — E119 Type 2 diabetes mellitus without complications: Secondary | ICD-10-CM | POA: Diagnosis not present

## 2015-11-27 ENCOUNTER — Ambulatory Visit (HOSPITAL_COMMUNITY): Payer: Self-pay | Admitting: Psychiatry

## 2015-12-02 ENCOUNTER — Ambulatory Visit (INDEPENDENT_AMBULATORY_CARE_PROVIDER_SITE_OTHER): Payer: Medicare Other | Admitting: Psychiatry

## 2015-12-02 ENCOUNTER — Encounter (HOSPITAL_COMMUNITY): Payer: Self-pay | Admitting: Psychiatry

## 2015-12-02 VITALS — BP 111/65 | HR 82 | Ht 64.0 in | Wt 171.6 lb

## 2015-12-02 DIAGNOSIS — F411 Generalized anxiety disorder: Secondary | ICD-10-CM | POA: Diagnosis not present

## 2015-12-02 DIAGNOSIS — F331 Major depressive disorder, recurrent, moderate: Secondary | ICD-10-CM

## 2015-12-02 MED ORDER — DIAZEPAM 10 MG PO TABS: 10.0000 mg | ORAL_TABLET | Freq: Two times a day (BID) | ORAL | Status: DC

## 2015-12-02 MED ORDER — MIRTAZAPINE 30 MG PO TABS: 30.0000 mg | ORAL_TABLET | Freq: Every day | ORAL | Status: AC

## 2015-12-02 MED ORDER — PAROXETINE HCL 40 MG PO TABS: 40.0000 mg | ORAL_TABLET | Freq: Every day | ORAL | Status: DC

## 2015-12-02 NOTE — Progress Notes (Signed)
Patient ID: RANIA PROTHERO, female   DOB: 03-Nov-1940, 75 y.o.   MRN: [id number] Patient ID: THY GULLIKSON, female   DOB: July 31, 1941, 75 y.o.   MRN: [id number] Patient ID: LESSIE FUNDERBURKE, female   DOB: May 24, 1941, 75 y.o.   MRN: [id number] Patient ID: SADIA BELFIORE, female   DOB: Oct 04, 1940, 75 y.o.   MRN: [id number] Patient ID: MARIETA MARKOV, female   DOB: 05-05-1941, 75 y.o.   MRN: [id number] Patient ID: EMMARY CULBREATH, female   DOB: [birth year], 75 y.o.   MRN: [id number] Patient ID: NITHYA MERIWEATHER, female   DOB: 1941-03-22, 75 y.o.   MRN: [id number] Patient ID: CLINTON WAHLBERG, female   DOB: 25-Oct-1940, 75 y.o.   MRN: [id number] Patient ID: GENAVIVE KUBICKI, female   DOB: [birth year], 75 y.o.   MRN: [id number] Patient ID: RYANE KONIECZNY, female   DOB: [birth year], 75 y.o.   MRN: [id number]  Psychiatric Assessment Adult  Patient Identification:  ALLEGRA CERNIGLIA Date of Evaluation:  12/02/2015 Chief Complaint: I'm  a little down History of Chief Complaint:   Chief Complaint  Patient presents with  . Depression  . Anxiety  . Follow-up    Depression        Past medical history includes anxiety.   Anxiety Symptoms include nervous/anxious behavior.     this patient is a 75 year old widowed white female who lives alone in [place]. Her husband died in Jan 25, 2007. 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  The patient was referred by Dr. Dimas Aguas of [facility] for treatment of depression.  The patient states that she's had anxiety since her children were born in her 18s. She developed depression in her 30s. She's never had suicidal ideation or psychiatric hospitalizations or psychotic symptoms. She's always been treated by her family physician. She's had ups and downs of her life but she's been particularly depressed since her husband died in 2007/01/25 of colon cancer.  Her family Dr. as had her on a combination of Paxil 30 mg, Abilify 2 mg ,  and Xanax 1 mg up to 4 times a day. She was doing fairly well up until it 2 or 3 months ago. She was cutting the Abilify in half and it wasn't working so she increased it to 2 mg again. Still wasn't working and then her drugstore switched her to a generic brand. Even while going back to the brand name it has not helped. Currently she feels sad all the time and has crying spells. She feels jittery clammy and has hot flashes when she wakes up. She has dreams about her husband and misses him terribly. She takes an over-the-counter sleeping pill. She has no energy. She is very strong in her faith and spends time going to church and visiting friends and family but she has to really push herself. She has never been suicidal. She has lost her appetite and has lost 25 pounds since January. At times she has had high energy spells at last for several days and she cleans incessantly. However at this point she is very depressed  The patient returns after 3 months. She is here alone. She states that her cataract surgery went fairly well. She is doing okay living by herself but doesn't get out much. She still doesn't feel like going back to the [facility]. She states her mood is fairly good in most the time she sleeps well. She is excited about  a church revival lettuce coming up. She doesn't look as shaky and anxious as she has in the past Review of Systems  Constitutional: Positive for activity change and unexpected weight change.  HENT: Negative.   Eyes: Negative.   Respiratory: Negative.   Cardiovascular: Negative.   Gastrointestinal: Positive for constipation.  Endocrine: Negative.   Genitourinary: Negative.   Musculoskeletal: Positive for back pain.  Allergic/Immunologic: Negative.   Neurological: Negative.   Hematological: Negative.   Psychiatric/Behavioral: Positive for depression and dysphoric mood. The patient is nervous/anxious.    Physical Exam not done  Depressive Symptoms: depressed  mood, anhedonia, psychomotor retardation, fatigue, difficulty concentrating, panic attacks,  (Hypo) Manic Symptoms:   Elevated Mood:  No Irritable Mood:  No Grandiosity:  No Distractibility:  No Labiality of Mood:  Yes Delusions:  No Hallucinations:  No Impulsivity:  No Sexually Inappropriate Behavior:  No Financial Extravagance:  No Flight of Ideas:  No  Anxiety Symptoms: Excessive Worry:  Yes Panic Symptoms:  Yes Agoraphobia:  No Obsessive Compulsive: No  Symptoms: None, Specific Phobias:  No Social Anxiety:  No  Psychotic Symptoms:  Hallucinations: No None Delusions:  No Paranoia:  No   Ideas of Reference:  No  PTSD Symptoms: Ever had a traumatic exposure:  No Had a traumatic exposure in the last month:  No Re-experiencing: No None Hypervigilance:  No Hyperarousal: No None Avoidance: No None  Traumatic Brain Injury: No   Past Psychiatric History: Diagnosis: Maj. depression   Hospitalizations: None   Outpatient Care: Has seen mental health providers but doesn't remember any names   Substance Abuse Care: none  Self-Mutilation: none  Suicidal Attempts: none  Violent Behaviors: none   Past Medical History:   Past Medical History  Diagnosis Date  . Thyroid disease   . Diabetes mellitus, type II (HCC)   . Constipation   . GERD (gastroesophageal reflux disease)   . Back pain   . Depression   . Anxiety    History of Loss of Consciousness:  No Seizure History:  No Cardiac History:  No Allergies:   Allergies  Allergen Reactions  . Cyclobenzaprine     hallucinations  . Ultram [Tramadol]     hallucinations   Current Medications:  Current Outpatient Prescriptions  Medication Sig Dispense Refill  . acetaminophen (TYLENOL) 500 MG tablet Take 500 mg by mouth every 6 (six) hours as needed for mild pain.    . ARIPiprazole (ABILIFY) 10 MG tablet Take 10 mg by mouth daily.    . diazepam (VALIUM) 10 MG tablet Take 1 tablet (10 mg total) by mouth 2 (two)  times daily. 60 tablet 2  . levothyroxine (SYNTHROID, LEVOTHROID) 75 MCG tablet Take 75 mcg by mouth daily before breakfast.    . metFORMIN (GLUCOPHAGE-XR) 500 MG 24 hr tablet Take 500 mg by mouth daily.    . mirtazapine (REMERON) 30 MG tablet Take 1 tablet (30 mg total) by mouth at bedtime. 30 tablet 2  . omeprazole (PRILOSEC) 20 MG capsule Take 20 mg by mouth daily.    Marland Kitchen PARoxetine (PAXIL) 40 MG tablet Take 1 tablet (40 mg total) by mouth daily. 30 tablet 2   No current facility-administered medications for this visit.    Previous Psychotropic Medications:  Medication Dose                          Substance Abuse History in the last 12 months: Substance Age of 1st Use Last Use  Amount Specific Type  Nicotine      Alcohol      Cannabis      Opiates      Cocaine      Methamphetamines      LSD      Ecstasy      Benzodiazepines      Caffeine      Inhalants      Others:                          Medical Consequences of Substance Abuse: n/a  Legal Consequences of Substance Abuse: n/a  Family Consequences of Substance Abuse: n/a  Blackouts:  No DT's:  No Withdrawal Symptoms:  No None  Social History: Current [place] Residence: [place] Place of Birth: [place] Family Members: 3 sisters, several grandchildren and great-grandchildren, 3 children Marital Status:  Widowed Children: 3    Education:  GED Educational Problems/Performance:  Religious Beliefs/Practices: Christian History of Abuse: none Armed forces technical officerccupational Experiences; housewife Hotel managerMilitary History:  None. Legal History: none Hobbies/Interests: Visiting family, cleaning  Family History:   Family History  Problem Relation Age of Onset  . Depression Sister   . Alcohol abuse Brother   . Alcohol abuse Son   . Alcohol abuse Father     Mental Status Examination/Evaluation: Objective:  Appearance: Casual, Neat and Well Groomed walking much better on her own today   Eye Contact::   Good  Speech:  Slow  Volume:  Decreased  Mood: Slightly depressed   Affect:  A little blunted   Thought Process:  Circumstantial  Orientation:  Full (Time, Place, and Person)  Thought Content:  Rumination  Suicidal Thoughts:  No  Homicidal Thoughts:  No  Judgement:  Fair  Insight:  Fair  Psychomotor Activity:  Decreased  Akathisia:  No  Handed:  Right  AIMS (if indicated):    Assets:  Communication Skills Desire for Improvement Social Support    Laboratory/X-Ray Psychological Evaluation(s)        Assessment:  Axis I: Major Depression, Recurrent severe  AXIS I Major Depression, Recurrent severe  AXIS II Deferred  AXIS III Past Medical History  Diagnosis Date  . Thyroid disease   . Diabetes mellitus, type II (HCC)   . Constipation   . GERD (gastroesophageal reflux disease)   . Back pain   . Depression   . Anxiety      AXIS IV other psychosocial or environmental problems  AXIS V 41-50 serious symptoms   Treatment Plan/Recommendations:  Plan of Care: Medication management   Laboratory:  Psychotherapy: She declines counseling due to the cost   Medications: She'll continue Paxil to 40 mg per day for depression. She'll continue  diazepam10 mg twice a day for anxiety. She continues to get [place] from [place] and I explained that I can't participate in this.  Continue mirtazapine 30 mg at bedtime for sleep   Routine PRN Medications:  No  Consultations:   Safety Concerns:  She denies thoughts of self-harm   Other: She will return in  3 months    Diannia RuderOSS, DEBORAH, MD 3/29/20173:06 PM

## 2016-01-04 DIAGNOSIS — H04123 Dry eye syndrome of bilateral lacrimal glands: Secondary | ICD-10-CM | POA: Diagnosis not present

## 2016-01-04 DIAGNOSIS — H353222 Exudative age-related macular degeneration, left eye, with inactive choroidal neovascularization: Secondary | ICD-10-CM | POA: Diagnosis not present

## 2016-01-04 DIAGNOSIS — Z961 Presence of intraocular lens: Secondary | ICD-10-CM | POA: Diagnosis not present

## 2016-01-04 DIAGNOSIS — H16141 Punctate keratitis, right eye: Secondary | ICD-10-CM | POA: Diagnosis not present

## 2016-01-25 ENCOUNTER — Telehealth (HOSPITAL_COMMUNITY): Payer: Self-pay | Admitting: *Deleted

## 2016-01-25 NOTE — Telephone Encounter (Signed)
Pt called stating she is doing well but need to see if she could take an extra Valium at dinner. Per pt, at the end of the day she gets jittery. Pt number is [phone]. Pt appt is June 28.

## 2016-01-26 DIAGNOSIS — M79602 Pain in left arm: Secondary | ICD-10-CM | POA: Diagnosis not present

## 2016-01-26 DIAGNOSIS — Z79899 Other long term (current) drug therapy: Secondary | ICD-10-CM | POA: Diagnosis not present

## 2016-01-26 DIAGNOSIS — E119 Type 2 diabetes mellitus without complications: Secondary | ICD-10-CM | POA: Diagnosis not present

## 2016-01-26 DIAGNOSIS — K219 Gastro-esophageal reflux disease without esophagitis: Secondary | ICD-10-CM | POA: Diagnosis not present

## 2016-01-26 DIAGNOSIS — M25432 Effusion, left wrist: Secondary | ICD-10-CM | POA: Diagnosis not present

## 2016-01-26 DIAGNOSIS — W1849XA Other slipping, tripping and stumbling without falling, initial encounter: Secondary | ICD-10-CM | POA: Diagnosis not present

## 2016-01-26 DIAGNOSIS — E039 Hypothyroidism, unspecified: Secondary | ICD-10-CM | POA: Diagnosis not present

## 2016-01-26 DIAGNOSIS — S63502A Unspecified sprain of left wrist, initial encounter: Secondary | ICD-10-CM | POA: Diagnosis not present

## 2016-01-26 DIAGNOSIS — Z7984 Long term (current) use of oral hypoglycemic drugs: Secondary | ICD-10-CM | POA: Diagnosis not present

## 2016-01-26 NOTE — Telephone Encounter (Signed)
Yes its ok

## 2016-01-26 NOTE — Telephone Encounter (Signed)
Called pt and informed her of what provider stated and she showed understanding.

## 2016-01-27 DIAGNOSIS — Z961 Presence of intraocular lens: Secondary | ICD-10-CM | POA: Diagnosis not present

## 2016-02-04 DIAGNOSIS — Z1231 Encounter for screening mammogram for malignant neoplasm of breast: Secondary | ICD-10-CM | POA: Diagnosis not present

## 2016-02-08 ENCOUNTER — Telehealth (HOSPITAL_COMMUNITY): Payer: Self-pay | Admitting: *Deleted

## 2016-02-08 DIAGNOSIS — M545 Low back pain: Secondary | ICD-10-CM | POA: Diagnosis not present

## 2016-02-08 DIAGNOSIS — F339 Major depressive disorder, recurrent, unspecified: Secondary | ICD-10-CM | POA: Diagnosis not present

## 2016-02-08 DIAGNOSIS — M797 Fibromyalgia: Secondary | ICD-10-CM | POA: Diagnosis not present

## 2016-02-08 DIAGNOSIS — I10 Essential (primary) hypertension: Secondary | ICD-10-CM | POA: Diagnosis not present

## 2016-02-08 DIAGNOSIS — M179 Osteoarthritis of knee, unspecified: Secondary | ICD-10-CM | POA: Diagnosis not present

## 2016-02-08 NOTE — Telephone Encounter (Signed)
phone call from patient, she need refill of Valium.

## 2016-02-08 NOTE — Telephone Encounter (Signed)
Called pt pharmacy and was informed that pt have Valium on hold and will get medication ready for her. Called pt and lmtcb and number provided

## 2016-02-09 ENCOUNTER — Telehealth (HOSPITAL_COMMUNITY): Payer: Self-pay | Admitting: *Deleted

## 2016-02-09 DIAGNOSIS — W1839XA Other fall on same level, initial encounter: Secondary | ICD-10-CM | POA: Diagnosis not present

## 2016-02-09 DIAGNOSIS — S065X0A Traumatic subdural hemorrhage without loss of consciousness, initial encounter: Secondary | ICD-10-CM | POA: Diagnosis not present

## 2016-02-09 DIAGNOSIS — G968 Other specified disorders of central nervous system: Secondary | ICD-10-CM | POA: Diagnosis not present

## 2016-02-09 DIAGNOSIS — Z602 Problems related to living alone: Secondary | ICD-10-CM | POA: Diagnosis not present

## 2016-02-09 DIAGNOSIS — Z7984 Long term (current) use of oral hypoglycemic drugs: Secondary | ICD-10-CM | POA: Diagnosis not present

## 2016-02-09 DIAGNOSIS — E039 Hypothyroidism, unspecified: Secondary | ICD-10-CM | POA: Diagnosis not present

## 2016-02-09 DIAGNOSIS — Z7401 Bed confinement status: Secondary | ICD-10-CM | POA: Diagnosis not present

## 2016-02-09 DIAGNOSIS — M25551 Pain in right hip: Secondary | ICD-10-CM | POA: Diagnosis not present

## 2016-02-09 DIAGNOSIS — I6203 Nontraumatic chronic subdural hemorrhage: Secondary | ICD-10-CM | POA: Diagnosis not present

## 2016-02-09 DIAGNOSIS — Z9181 History of falling: Secondary | ICD-10-CM | POA: Diagnosis not present

## 2016-02-09 DIAGNOSIS — M545 Low back pain: Secondary | ICD-10-CM | POA: Diagnosis not present

## 2016-02-09 DIAGNOSIS — I6201 Nontraumatic acute subdural hemorrhage: Secondary | ICD-10-CM | POA: Diagnosis not present

## 2016-02-09 DIAGNOSIS — K219 Gastro-esophageal reflux disease without esophagitis: Secondary | ICD-10-CM | POA: Diagnosis not present

## 2016-02-09 DIAGNOSIS — R51 Headache: Secondary | ICD-10-CM | POA: Diagnosis not present

## 2016-02-09 DIAGNOSIS — R402253 Coma scale, best verbal response, oriented, at hospital admission: Secondary | ICD-10-CM | POA: Diagnosis not present

## 2016-02-09 DIAGNOSIS — Z743 Need for continuous supervision: Secondary | ICD-10-CM | POA: Diagnosis not present

## 2016-02-09 DIAGNOSIS — R0902 Hypoxemia: Secondary | ICD-10-CM | POA: Diagnosis not present

## 2016-02-09 DIAGNOSIS — Z79891 Long term (current) use of opiate analgesic: Secondary | ICD-10-CM | POA: Diagnosis not present

## 2016-02-09 DIAGNOSIS — R296 Repeated falls: Secondary | ICD-10-CM | POA: Diagnosis not present

## 2016-02-09 DIAGNOSIS — R400 Somnolence: Secondary | ICD-10-CM | POA: Diagnosis not present

## 2016-02-09 DIAGNOSIS — R52 Pain, unspecified: Secondary | ICD-10-CM | POA: Diagnosis not present

## 2016-02-09 DIAGNOSIS — S79911A Unspecified injury of right hip, initial encounter: Secondary | ICD-10-CM | POA: Diagnosis not present

## 2016-02-09 DIAGNOSIS — R531 Weakness: Secondary | ICD-10-CM | POA: Diagnosis not present

## 2016-02-09 DIAGNOSIS — Z885 Allergy status to narcotic agent status: Secondary | ICD-10-CM | POA: Diagnosis not present

## 2016-02-09 DIAGNOSIS — E119 Type 2 diabetes mellitus without complications: Secondary | ICD-10-CM | POA: Diagnosis not present

## 2016-02-09 DIAGNOSIS — R402363 Coma scale, best motor response, obeys commands, at hospital admission: Secondary | ICD-10-CM | POA: Diagnosis not present

## 2016-02-09 DIAGNOSIS — R402133 Coma scale, eyes open, to sound, at hospital admission: Secondary | ICD-10-CM | POA: Diagnosis not present

## 2016-02-09 DIAGNOSIS — X58XXXD Exposure to other specified factors, subsequent encounter: Secondary | ICD-10-CM | POA: Diagnosis not present

## 2016-02-09 DIAGNOSIS — R6889 Other general symptoms and signs: Secondary | ICD-10-CM | POA: Diagnosis not present

## 2016-02-09 DIAGNOSIS — I6200 Nontraumatic subdural hemorrhage, unspecified: Secondary | ICD-10-CM | POA: Diagnosis not present

## 2016-02-09 DIAGNOSIS — M6281 Muscle weakness (generalized): Secondary | ICD-10-CM | POA: Diagnosis not present

## 2016-02-09 DIAGNOSIS — Z79899 Other long term (current) drug therapy: Secondary | ICD-10-CM | POA: Diagnosis not present

## 2016-02-09 DIAGNOSIS — Z79890 Hormone replacement therapy (postmenopausal): Secondary | ICD-10-CM | POA: Diagnosis not present

## 2016-02-09 DIAGNOSIS — S065X0D Traumatic subdural hemorrhage without loss of consciousness, subsequent encounter: Secondary | ICD-10-CM | POA: Diagnosis not present

## 2016-02-09 DIAGNOSIS — M5440 Lumbago with sciatica, unspecified side: Secondary | ICD-10-CM | POA: Diagnosis not present

## 2016-02-09 DIAGNOSIS — M5489 Other dorsalgia: Secondary | ICD-10-CM | POA: Diagnosis not present

## 2016-02-09 DIAGNOSIS — K5900 Constipation, unspecified: Secondary | ICD-10-CM | POA: Diagnosis not present

## 2016-02-09 NOTE — Telephone Encounter (Signed)
Called pt granddaughter and informed her and verbalized understand.

## 2016-02-09 NOTE — Telephone Encounter (Signed)
voice message from patient, she would like Octavia to call her.

## 2016-02-09 NOTE — Telephone Encounter (Signed)
Called pt back due to previous message. lmtcb and number provided.

## 2016-02-09 NOTE — Telephone Encounter (Signed)
Tell grandaughter to cut it down to twice a day

## 2016-02-09 NOTE — Telephone Encounter (Signed)
Pt granddaughter Lenn SinkSamantha Johnson called stating to inform Dr. Tenny Crawoss that pt do not need that extra Valium at dinner time that she requested. Per pt granddaughter, pt fell about 5 times today and several times yesterday. Per pt granddaughter, she is on her way to talk to her and check her medications. Per pt granddaughter, 3 Valium a day is a bit too much for pt right now. Pt granddaughter number is [phone].

## 2016-02-10 NOTE — Telephone Encounter (Signed)
Called pt and lmtcb due to previous phone call. Number provided for pt to call office back

## 2016-02-15 ENCOUNTER — Telehealth (HOSPITAL_COMMUNITY): Payer: Self-pay | Admitting: *Deleted

## 2016-02-15 NOTE — Telephone Encounter (Signed)
Pt granddaughter called office back due to previous call. Per pt granddaughter, [facility] Tuesday due to frequent falls. Per pt granddaughter, they will call office back to resch once pt recovers well. Pt granddaughter number is [phone].

## 2016-02-15 NOTE — Telephone Encounter (Signed)
noted 

## 2016-02-15 NOTE — Telephone Encounter (Signed)
Pt called stating [facility] and it is the main one and can not remember the name. Per pt, its the one she always come to. Per pt, she will not be able to see Dr. Tenny Crawoss until Aug. Per pt, due to her falling so many times she was bleeding inside her brain. Per pt, it eventually stopped. Per pt, she is currently in the hospital and granddaughter's number is [phone]. Tried calling pt granddaughter and lmtcb.

## 2016-02-15 NOTE — Telephone Encounter (Signed)
Ok, thanks.

## 2016-02-19 DIAGNOSIS — M542 Cervicalgia: Secondary | ICD-10-CM | POA: Diagnosis not present

## 2016-02-19 DIAGNOSIS — S065X0A Traumatic subdural hemorrhage without loss of consciousness, initial encounter: Secondary | ICD-10-CM | POA: Diagnosis not present

## 2016-02-19 DIAGNOSIS — S065X0D Traumatic subdural hemorrhage without loss of consciousness, subsequent encounter: Secondary | ICD-10-CM | POA: Diagnosis not present

## 2016-02-19 DIAGNOSIS — S199XXA Unspecified injury of neck, initial encounter: Secondary | ICD-10-CM | POA: Diagnosis not present

## 2016-02-19 DIAGNOSIS — I6200 Nontraumatic subdural hemorrhage, unspecified: Secondary | ICD-10-CM | POA: Diagnosis not present

## 2016-02-19 DIAGNOSIS — M6281 Muscle weakness (generalized): Secondary | ICD-10-CM | POA: Diagnosis not present

## 2016-02-19 DIAGNOSIS — Z7984 Long term (current) use of oral hypoglycemic drugs: Secondary | ICD-10-CM | POA: Diagnosis not present

## 2016-02-19 DIAGNOSIS — W010XXA Fall on same level from slipping, tripping and stumbling without subsequent striking against object, initial encounter: Secondary | ICD-10-CM | POA: Diagnosis not present

## 2016-02-19 DIAGNOSIS — M545 Low back pain: Secondary | ICD-10-CM | POA: Diagnosis not present

## 2016-02-19 DIAGNOSIS — I959 Hypotension, unspecified: Secondary | ICD-10-CM | POA: Diagnosis not present

## 2016-02-19 DIAGNOSIS — K219 Gastro-esophageal reflux disease without esophagitis: Secondary | ICD-10-CM | POA: Diagnosis not present

## 2016-02-19 DIAGNOSIS — Z886 Allergy status to analgesic agent status: Secondary | ICD-10-CM | POA: Diagnosis not present

## 2016-02-19 DIAGNOSIS — K5900 Constipation, unspecified: Secondary | ICD-10-CM | POA: Diagnosis not present

## 2016-02-19 DIAGNOSIS — Z7401 Bed confinement status: Secondary | ICD-10-CM | POA: Diagnosis not present

## 2016-02-19 DIAGNOSIS — R6889 Other general symptoms and signs: Secondary | ICD-10-CM | POA: Diagnosis not present

## 2016-02-19 DIAGNOSIS — X58XXXD Exposure to other specified factors, subsequent encounter: Secondary | ICD-10-CM | POA: Diagnosis not present

## 2016-02-19 DIAGNOSIS — M546 Pain in thoracic spine: Secondary | ICD-10-CM | POA: Diagnosis not present

## 2016-02-19 DIAGNOSIS — E119 Type 2 diabetes mellitus without complications: Secondary | ICD-10-CM | POA: Diagnosis not present

## 2016-02-19 DIAGNOSIS — E039 Hypothyroidism, unspecified: Secondary | ICD-10-CM | POA: Diagnosis not present

## 2016-02-19 DIAGNOSIS — R221 Localized swelling, mass and lump, neck: Secondary | ICD-10-CM | POA: Diagnosis not present

## 2016-02-19 DIAGNOSIS — Z888 Allergy status to other drugs, medicaments and biological substances status: Secondary | ICD-10-CM | POA: Diagnosis not present

## 2016-02-19 DIAGNOSIS — Z9181 History of falling: Secondary | ICD-10-CM | POA: Diagnosis not present

## 2016-02-19 DIAGNOSIS — R220 Localized swelling, mass and lump, head: Secondary | ICD-10-CM | POA: Diagnosis not present

## 2016-02-19 DIAGNOSIS — S0990XA Unspecified injury of head, initial encounter: Secondary | ICD-10-CM | POA: Diagnosis not present

## 2016-02-19 DIAGNOSIS — R0902 Hypoxemia: Secondary | ICD-10-CM | POA: Diagnosis not present

## 2016-02-19 DIAGNOSIS — S3992XA Unspecified injury of lower back, initial encounter: Secondary | ICD-10-CM | POA: Diagnosis not present

## 2016-02-23 DIAGNOSIS — I6200 Nontraumatic subdural hemorrhage, unspecified: Secondary | ICD-10-CM | POA: Diagnosis not present

## 2016-02-26 ENCOUNTER — Inpatient Hospital Stay (HOSPITAL_COMMUNITY): Payer: Medicare Other | Admitting: Certified Registered Nurse Anesthetist

## 2016-02-26 ENCOUNTER — Inpatient Hospital Stay (HOSPITAL_COMMUNITY)
Admission: EM | Admit: 2016-02-26 | Discharge: 2016-03-02 | DRG: 027 | Disposition: A | Discharge status: Skilled Nursing Facility | Payer: Medicare Other | Attending: Neurosurgery | Admitting: Neurosurgery

## 2016-02-26 ENCOUNTER — Encounter (HOSPITAL_COMMUNITY)
Admission: EM | Disposition: A | Discharge status: Skilled Nursing Facility | Payer: Self-pay | Source: Home / Self Care | Attending: Neurosurgery

## 2016-02-26 ENCOUNTER — Emergency Department (HOSPITAL_COMMUNITY): Payer: Medicare Other

## 2016-02-26 ENCOUNTER — Encounter (HOSPITAL_COMMUNITY): Payer: Self-pay

## 2016-02-26 ENCOUNTER — Other Ambulatory Visit: Payer: Self-pay

## 2016-02-26 DIAGNOSIS — S065XAA Traumatic subdural hemorrhage with loss of consciousness status unknown, initial encounter: Secondary | ICD-10-CM | POA: Diagnosis present

## 2016-02-26 DIAGNOSIS — W010XXA Fall on same level from slipping, tripping and stumbling without subsequent striking against object, initial encounter: Secondary | ICD-10-CM | POA: Diagnosis not present

## 2016-02-26 DIAGNOSIS — R221 Localized swelling, mass and lump, neck: Secondary | ICD-10-CM | POA: Diagnosis not present

## 2016-02-26 DIAGNOSIS — E119 Type 2 diabetes mellitus without complications: Secondary | ICD-10-CM | POA: Diagnosis present

## 2016-02-26 DIAGNOSIS — Z888 Allergy status to other drugs, medicaments and biological substances status: Secondary | ICD-10-CM

## 2016-02-26 DIAGNOSIS — Z9181 History of falling: Secondary | ICD-10-CM | POA: Diagnosis not present

## 2016-02-26 DIAGNOSIS — M546 Pain in thoracic spine: Secondary | ICD-10-CM | POA: Diagnosis not present

## 2016-02-26 DIAGNOSIS — I6200 Nontraumatic subdural hemorrhage, unspecified: Secondary | ICD-10-CM | POA: Diagnosis not present

## 2016-02-26 DIAGNOSIS — Z886 Allergy status to analgesic agent status: Secondary | ICD-10-CM

## 2016-02-26 DIAGNOSIS — S3992XA Unspecified injury of lower back, initial encounter: Secondary | ICD-10-CM | POA: Diagnosis not present

## 2016-02-26 DIAGNOSIS — S065X0A Traumatic subdural hemorrhage without loss of consciousness, initial encounter: Principal | ICD-10-CM | POA: Diagnosis present

## 2016-02-26 DIAGNOSIS — S065X9A Traumatic subdural hemorrhage with loss of consciousness of unspecified duration, initial encounter: Secondary | ICD-10-CM | POA: Diagnosis present

## 2016-02-26 DIAGNOSIS — Z7984 Long term (current) use of oral hypoglycemic drugs: Secondary | ICD-10-CM | POA: Diagnosis not present

## 2016-02-26 DIAGNOSIS — I959 Hypotension, unspecified: Secondary | ICD-10-CM | POA: Diagnosis not present

## 2016-02-26 DIAGNOSIS — R220 Localized swelling, mass and lump, head: Secondary | ICD-10-CM | POA: Diagnosis not present

## 2016-02-26 DIAGNOSIS — M542 Cervicalgia: Secondary | ICD-10-CM | POA: Diagnosis not present

## 2016-02-26 DIAGNOSIS — S0990XA Unspecified injury of head, initial encounter: Secondary | ICD-10-CM | POA: Diagnosis not present

## 2016-02-26 DIAGNOSIS — M545 Low back pain: Secondary | ICD-10-CM | POA: Diagnosis not present

## 2016-02-26 DIAGNOSIS — S199XXA Unspecified injury of neck, initial encounter: Secondary | ICD-10-CM | POA: Diagnosis not present

## 2016-02-26 DIAGNOSIS — K219 Gastro-esophageal reflux disease without esophagitis: Secondary | ICD-10-CM | POA: Diagnosis not present

## 2016-02-26 HISTORY — PX: BURR HOLE: SHX908

## 2016-02-26 LAB — CBC
HEMATOCRIT: 42.8 % (ref 36.0–46.0)
Hemoglobin: 13.8 g/dL (ref 12.0–15.0)
MCH: 30.6 pg (ref 26.0–34.0)
MCHC: 32.2 g/dL (ref 30.0–36.0)
MCV: 94.9 fL (ref 78.0–100.0)
Platelets: 374 10*3/uL (ref 150–400)
RBC: 4.51 MIL/uL (ref 3.87–5.11)
RDW: 13.7 % (ref 11.5–15.5)
WBC: 10.7 10*3/uL — AB (ref 4.0–10.5)

## 2016-02-26 LAB — URINALYSIS, ROUTINE W REFLEX MICROSCOPIC
Bilirubin Urine: NEGATIVE
Glucose, UA: NEGATIVE mg/dL
HGB URINE DIPSTICK: NEGATIVE
Ketones, ur: 15 mg/dL — AB
Nitrite: NEGATIVE
PH: 6 (ref 5.0–8.0)
Protein, ur: NEGATIVE mg/dL
SPECIFIC GRAVITY, URINE: 1.021 (ref 1.005–1.030)

## 2016-02-26 LAB — BASIC METABOLIC PANEL
Anion gap: 9 (ref 5–15)
BUN: 19 mg/dL (ref 6–20)
CHLORIDE: 105 mmol/L (ref 101–111)
CO2: 25 mmol/L (ref 22–32)
Calcium: 9.2 mg/dL (ref 8.9–10.3)
Creatinine, Ser: 0.78 mg/dL (ref 0.44–1.00)
GFR calc Af Amer: >60 mL/min (ref >60)
GFR calc non Af Amer: >60 mL/min (ref >60)
GLUCOSE: 121 mg/dL — AB (ref 65–99)
POTASSIUM: 3.8 mmol/L (ref 3.5–5.1)
Sodium: 139 mmol/L (ref 135–145)

## 2016-02-26 LAB — URINE MICROSCOPIC-ADD ON: RBC / HPF: NONE SEEN RBC/hpf (ref 0–5)

## 2016-02-26 LAB — GLUCOSE, CAPILLARY
Glucose-Capillary: 85 mg/dL (ref 65–99)
Glucose-Capillary: 81 mg/dL (ref 65–99)
Glucose-Capillary: 118 mg/dL — ABNORMAL HIGH (ref 65–99)

## 2016-02-26 LAB — TROPONIN I: Troponin I: <0.03 ng/mL (ref <0.031)

## 2016-02-26 LAB — SURGICAL PCR SCREEN
MRSA, PCR: NEGATIVE
STAPHYLOCOCCUS AUREUS: NEGATIVE

## 2016-02-26 LAB — MAGNESIUM: Magnesium: 1.9 mg/dL (ref 1.7–2.4)

## 2016-02-26 SURGERY — CREATION, CRANIAL BURR HOLE
Anesthesia: General | Site: Head | Laterality: Left

## 2016-02-26 MED ORDER — CETYLPYRIDINIUM CHLORIDE 0.05 % MT LIQD
7.0000 mL | Freq: Two times a day (BID) | OROMUCOSAL | Status: DC
  Administered 2016-02-27 – 2016-03-02 (×9): 7 mL via OROMUCOSAL

## 2016-02-26 MED ORDER — ARIPIPRAZOLE 10 MG PO TABS
10.0000 mg | ORAL_TABLET | ORAL | Status: DC
  Administered 2016-02-27 – 2016-03-02 (×3): 10 mg via ORAL
  Filled 2016-02-26: qty 1
  Filled 2016-02-26: qty 2
  Filled 2016-02-26: qty 1
  Filled 2016-02-26: qty 2

## 2016-02-26 MED ORDER — CEFAZOLIN SODIUM-DEXTROSE 2-4 GM/100ML-% IV SOLN: 2.0000 g | Freq: Three times a day (TID) | INTRAVENOUS | Status: DC

## 2016-02-26 MED ORDER — LABETALOL HCL 5 MG/ML IV SOLN: 10.0000 mg | INTRAVENOUS | Status: DC | PRN

## 2016-02-26 MED ORDER — ONDANSETRON HCL 4 MG PO TABS
4.0000 mg | ORAL_TABLET | ORAL | Status: DC | PRN
  Administered 2016-02-28 – 2016-03-02 (×3): 4 mg via ORAL
  Filled 2016-02-26 (×3): qty 1

## 2016-02-26 MED ORDER — FENTANYL CITRATE (PF) 100 MCG/2ML IJ SOLN
INTRAMUSCULAR | Status: AC
  Filled 2016-02-26: qty 2

## 2016-02-26 MED ORDER — EPHEDRINE SULFATE 50 MG/ML IJ SOLN
INTRAMUSCULAR | Status: DC | PRN
  Administered 2016-02-26: 20 mg via INTRAVENOUS
  Administered 2016-02-26: 10 mg via INTRAVENOUS

## 2016-02-26 MED ORDER — DIAZEPAM 5 MG PO TABS: 10.0000 mg | ORAL_TABLET | Freq: Two times a day (BID) | ORAL | Status: DC

## 2016-02-26 MED ORDER — HYDROMORPHONE HCL 1 MG/ML IJ SOLN: 0.5000 mg | INTRAMUSCULAR | Status: DC | PRN

## 2016-02-26 MED ORDER — SENNA-DOCUSATE SODIUM 8.6-50 MG PO TABS: 2.0000 | ORAL_TABLET | Freq: Every day | ORAL | Status: DC

## 2016-02-26 MED ORDER — CEFAZOLIN SODIUM-DEXTROSE 2-4 GM/100ML-% IV SOLN
2.0000 g | INTRAVENOUS | Status: AC
  Administered 2016-02-26: 2 g via INTRAVENOUS

## 2016-02-26 MED ORDER — LEVOTHYROXINE SODIUM 75 MCG PO TABS
75.0000 ug | ORAL_TABLET | Freq: Every day | ORAL | Status: DC
  Administered 2016-02-27 – 2016-03-02 (×5): 75 ug via ORAL
  Filled 2016-02-26 (×5): qty 1

## 2016-02-26 MED ORDER — HYDROCODONE-ACETAMINOPHEN 5-325 MG PO TABS
1.0000 | ORAL_TABLET | ORAL | Status: DC | PRN
  Administered 2016-02-27 – 2016-03-02 (×3): 1 via ORAL
  Filled 2016-02-26 (×3): qty 1

## 2016-02-26 MED ORDER — SODIUM CHLORIDE 0.9 % IJ SOLN
INTRAMUSCULAR | Status: AC
Start: 2016-02-26 — End: 2016-02-26
  Filled 2016-02-26: qty 10

## 2016-02-26 MED ORDER — PHENYLEPHRINE HCL 10 MG/ML IJ SOLN
INTRAMUSCULAR | Status: DC | PRN
  Administered 2016-02-26 (×3): 80 ug via INTRAVENOUS

## 2016-02-26 MED ORDER — FENTANYL CITRATE (PF) 250 MCG/5ML IJ SOLN
INTRAMUSCULAR | Status: AC
Start: 2016-02-26 — End: 2016-02-26
  Filled 2016-02-26: qty 5

## 2016-02-26 MED ORDER — ACETAMINOPHEN 325 MG PO TABS
650.0000 mg | ORAL_TABLET | ORAL | Status: DC | PRN
  Administered 2016-02-26 – 2016-03-01 (×3): 650 mg via ORAL
  Filled 2016-02-26 (×3): qty 2

## 2016-02-26 MED ORDER — LIDOCAINE-EPINEPHRINE 1 %-1:100000 IJ SOLN
INTRAMUSCULAR | Status: DC | PRN
  Administered 2016-02-26: 10 mL via INTRADERMAL

## 2016-02-26 MED ORDER — CEFAZOLIN SODIUM 1 G IJ SOLR
INTRAMUSCULAR | Status: AC
  Filled 2016-02-26: qty 20

## 2016-02-26 MED ORDER — FENTANYL CITRATE (PF) 100 MCG/2ML IJ SOLN
25.0000 ug | INTRAMUSCULAR | Status: DC | PRN
  Administered 2016-02-26: 25 ug via INTRAVENOUS

## 2016-02-26 MED ORDER — PAROXETINE HCL 20 MG PO TABS
40.0000 mg | ORAL_TABLET | Freq: Every day | ORAL | Status: DC
  Administered 2016-02-27 – 2016-03-02 (×5): 40 mg via ORAL
  Filled 2016-02-26 (×5): qty 2

## 2016-02-26 MED ORDER — METFORMIN HCL ER 500 MG PO TB24
500.0000 mg | ORAL_TABLET | Freq: Every day | ORAL | Status: DC
  Administered 2016-02-27 – 2016-03-02 (×5): 500 mg via ORAL
  Filled 2016-02-26 (×6): qty 1

## 2016-02-26 MED ORDER — MIRTAZAPINE 15 MG PO TABS
30.0000 mg | ORAL_TABLET | Freq: Every day | ORAL | Status: DC
  Administered 2016-02-26 – 2016-03-01 (×5): 30 mg via ORAL
  Filled 2016-02-26 (×5): qty 2

## 2016-02-26 MED ORDER — CEFAZOLIN IN D5W 1 GM/50ML IV SOLN
1.0000 g | Freq: Three times a day (TID) | INTRAVENOUS | Status: AC
  Administered 2016-02-27 (×2): 1 g via INTRAVENOUS
  Filled 2016-02-26 (×2): qty 50

## 2016-02-26 MED ORDER — PROMETHAZINE HCL 25 MG PO TABS: 12.5000 mg | ORAL_TABLET | ORAL | Status: DC | PRN

## 2016-02-26 MED ORDER — ONDANSETRON HCL 4 MG/2ML IJ SOLN
INTRAMUSCULAR | Status: DC | PRN
  Administered 2016-02-26: 4 mg via INTRAVENOUS

## 2016-02-26 MED ORDER — ONDANSETRON HCL 4 MG/2ML IJ SOLN
INTRAMUSCULAR | Status: AC
  Filled 2016-02-26: qty 4

## 2016-02-26 MED ORDER — PANTOPRAZOLE SODIUM 40 MG PO TBEC
40.0000 mg | DELAYED_RELEASE_TABLET | Freq: Every day | ORAL | Status: DC
  Administered 2016-02-27 – 2016-03-02 (×5): 40 mg via ORAL
  Filled 2016-02-26 (×4): qty 1

## 2016-02-26 MED ORDER — ONDANSETRON HCL 4 MG/2ML IJ SOLN
4.0000 mg | INTRAMUSCULAR | Status: DC | PRN
  Administered 2016-02-26: 4 mg via INTRAVENOUS
  Filled 2016-02-26: qty 2

## 2016-02-26 MED ORDER — PROPOFOL 10 MG/ML IV BOLUS
INTRAVENOUS | Status: DC | PRN
  Administered 2016-02-26: 110 mg via INTRAVENOUS

## 2016-02-26 MED ORDER — SODIUM CHLORIDE 0.9 % IR SOLN
Status: DC | PRN
  Administered 2016-02-26: 500 mL

## 2016-02-26 MED ORDER — NALOXONE HCL 0.4 MG/ML IJ SOLN: 0.0800 mg | INTRAMUSCULAR | Status: DC | PRN

## 2016-02-26 MED ORDER — SODIUM CHLORIDE 0.9 % IV SOLN
INTRAVENOUS | Status: DC
  Administered 2016-02-27 – 2016-03-01 (×3): via INTRAVENOUS

## 2016-02-26 MED ORDER — SODIUM CHLORIDE 0.9 % IV SOLN
INTRAVENOUS | Status: DC
  Administered 2016-02-26: 16:00:00 via INTRAVENOUS

## 2016-02-26 MED ORDER — SUCCINYLCHOLINE CHLORIDE 20 MG/ML IJ SOLN
INTRAMUSCULAR | Status: DC | PRN
  Administered 2016-02-26: 90 mg via INTRAVENOUS

## 2016-02-26 MED ORDER — PHENYLEPHRINE HCL 10 MG/ML IJ SOLN
10.0000 mg | INTRAVENOUS | Status: DC | PRN
  Administered 2016-02-26: 50 ug/min via INTRAVENOUS

## 2016-02-26 MED ORDER — FENTANYL CITRATE (PF) 100 MCG/2ML IJ SOLN
INTRAMUSCULAR | Status: DC | PRN
  Administered 2016-02-26: 50 ug via INTRAVENOUS

## 2016-02-26 MED ORDER — ACETAMINOPHEN 650 MG RE SUPP: 650.0000 mg | RECTAL | Status: DC | PRN

## 2016-02-26 MED ORDER — LIDOCAINE HCL (CARDIAC) 20 MG/ML IV SOLN
INTRAVENOUS | Status: DC | PRN
  Administered 2016-02-26: 50 mg via INTRAVENOUS

## 2016-02-26 MED ORDER — THROMBIN 20000 UNITS EX SOLR
CUTANEOUS | Status: DC | PRN
  Administered 2016-02-26: 20 mL via TOPICAL

## 2016-02-26 MED ORDER — SENNOSIDES-DOCUSATE SODIUM 8.6-50 MG PO TABS
2.0000 | ORAL_TABLET | Freq: Every day | ORAL | Status: DC
  Administered 2016-02-26 – 2016-03-01 (×5): 2 via ORAL
  Filled 2016-02-26 (×5): qty 2

## 2016-02-26 MED ORDER — SODIUM CHLORIDE 0.9 % IV SOLN
INTRAVENOUS | Status: DC
  Administered 2016-02-26 (×2): via INTRAVENOUS

## 2016-02-26 SURGICAL SUPPLY — 60 items
BAG DECANTER FOR FLEXI CONT (MISCELLANEOUS) ×3 IMPLANT
BANDAGE GAUZE 4  KLING STR (GAUZE/BANDAGES/DRESSINGS) IMPLANT
BLADE CLIPPER SURG (BLADE) IMPLANT
BLADE SURG 11 STRL SS (BLADE) ×3 IMPLANT
BNDG COHESIVE 4X5 TAN NS LF (GAUZE/BANDAGES/DRESSINGS) IMPLANT
BRUSH SCRUB EZ PLAIN DRY (MISCELLANEOUS) ×3 IMPLANT
BUR ACORN 6.0 (BURR) ×2 IMPLANT
BUR ACORN 6.0MM (BURR) ×1
BUR ADDG 1.1 (BURR) IMPLANT
BUR ADDG 1.1MM (BURR)
CANISTER SUCT 3000ML PPV (MISCELLANEOUS) ×3 IMPLANT
CLIP TI MEDIUM 6 (CLIP) IMPLANT
CORDS BIPOLAR (ELECTRODE) ×3 IMPLANT
DECANTER SPIKE VIAL GLASS SM (MISCELLANEOUS) ×3 IMPLANT
DRAPE NEUROLOGICAL W/INCISE (DRAPES) IMPLANT
DRAPE WARM FLUID 44X44 (DRAPE) ×3 IMPLANT
ELECT CAUTERY BLADE 6.4 (BLADE) ×3 IMPLANT
ELECT REM PT RETURN 9FT ADLT (ELECTROSURGICAL) ×3
ELECTRODE REM PT RTRN 9FT ADLT (ELECTROSURGICAL) ×1 IMPLANT
GAUZE SPONGE 4X4 12PLY STRL (GAUZE/BANDAGES/DRESSINGS) ×3 IMPLANT
GAUZE SPONGE 4X4 16PLY XRAY LF (GAUZE/BANDAGES/DRESSINGS) IMPLANT
GLOVE BIO SURGEON STRL SZ7 (GLOVE) ×6 IMPLANT
GLOVE BIOGEL PI IND STRL 7.5 (GLOVE) ×2 IMPLANT
GLOVE BIOGEL PI INDICATOR 7.5 (GLOVE) ×4
GLOVE ECLIPSE 7.0 STRL STRAW (GLOVE) ×3 IMPLANT
GLOVE ECLIPSE 9.0 STRL (GLOVE) ×6 IMPLANT
GLOVE EXAM NITRILE LRG STRL (GLOVE) IMPLANT
GLOVE EXAM NITRILE MD LF STRL (GLOVE) IMPLANT
GLOVE EXAM NITRILE XL STR (GLOVE) IMPLANT
GLOVE EXAM NITRILE XS STR PU (GLOVE) IMPLANT
GOWN STRL REUS W/ TWL LRG LVL3 (GOWN DISPOSABLE) ×2 IMPLANT
GOWN STRL REUS W/ TWL XL LVL3 (GOWN DISPOSABLE) ×1 IMPLANT
GOWN STRL REUS W/TWL 2XL LVL3 (GOWN DISPOSABLE) IMPLANT
GOWN STRL REUS W/TWL LRG LVL3 (GOWN DISPOSABLE) ×4
GOWN STRL REUS W/TWL XL LVL3 (GOWN DISPOSABLE) ×2
HEMOSTAT SURGICEL 2X14 (HEMOSTASIS) IMPLANT
HOOK DURA (MISCELLANEOUS) ×3 IMPLANT
KIT BASIN OR (CUSTOM PROCEDURE TRAY) ×3 IMPLANT
KIT ROOM TURNOVER OR (KITS) ×3 IMPLANT
LIQUID BAND (GAUZE/BANDAGES/DRESSINGS) ×3 IMPLANT
NEEDLE HYPO 25X1 1.5 SAFETY (NEEDLE) ×3 IMPLANT
NS IRRIG 1000ML POUR BTL (IV SOLUTION) ×3 IMPLANT
PACK CRANIOTOMY (CUSTOM PROCEDURE TRAY) ×3 IMPLANT
PAD ARMBOARD 7.5X6 YLW CONV (MISCELLANEOUS) ×9 IMPLANT
PATTIES SURGICAL .25X.25 (GAUZE/BANDAGES/DRESSINGS) IMPLANT
PATTIES SURGICAL .5 X.5 (GAUZE/BANDAGES/DRESSINGS) IMPLANT
PATTIES SURGICAL .5 X3 (DISPOSABLE) IMPLANT
PATTIES SURGICAL 1X1 (DISPOSABLE) IMPLANT
PIN MAYFIELD SKULL DISP (PIN) IMPLANT
SPONGE NEURO XRAY DETECT 1X3 (DISPOSABLE) IMPLANT
SPONGE SURGIFOAM ABS GEL 100 (HEMOSTASIS) IMPLANT
STAPLER VISISTAT 35W (STAPLE) ×3 IMPLANT
SUT NURALON 4 0 TR CR/8 (SUTURE) ×6 IMPLANT
SUT VIC AB 2-0 CT1 18 (SUTURE) ×3 IMPLANT
SYR CONTROL 10ML LL (SYRINGE) ×3 IMPLANT
TAPE CLOTH 1X10 TAN NS (GAUZE/BANDAGES/DRESSINGS) ×3 IMPLANT
TOWEL OR 17X24 6PK STRL BLUE (TOWEL DISPOSABLE) ×3 IMPLANT
TOWEL OR 17X26 10 PK STRL BLUE (TOWEL DISPOSABLE) ×3 IMPLANT
TRAY FOLEY W/METER SILVER 16FR (SET/KITS/TRAYS/PACK) IMPLANT
WATER STERILE IRR 1000ML POUR (IV SOLUTION) ×3 IMPLANT

## 2016-02-26 NOTE — ED Provider Notes (Signed)
CSN: [id number]     Arrival date & time 02/26/16  1102 History   First MD Initiated Contact with Patient 02/26/16 1128     Chief Complaint  Patient presents with  . Fall      HPI Pt was seen at 1135. Per pt, c/o sudden onset and resolution of one episode of trip and fall PTA. Pt states she was in the bathroom barefoot, reached down to pick something up and fell. Pt states "I fall a lot." Pt c/o head injury, neck and low back "pain." Pt's family states pt also fell 3 days ago, but told NH staff at that time she did not hit her head. Pt's family also states pt fell 2 weeks ago, was evaluated at [facility] ED, and dx with "blood in her brain." Pt was transferred to [facility] where she was observed overnight and discharged. Pt's daughter states she has had "memory issues" and "not finding the right words to say" since the fall 2 weeks ago.  Denies syncope/LOC, no AMS, no CP/SOB, no abd pain, no N/V/D, no focal motor weakness, no tingling/numbness in extremities.     Past Medical History  Diagnosis Date  . Thyroid disease   . Diabetes mellitus, type II (HCC)   . Constipation   . GERD (gastroesophageal reflux disease)   . Back pain   . Depression   . Anxiety    Past Surgical History  Procedure Laterality Date  . Abdominal hysterectomy    . Ulcer surgery    . Goiter removed    . Cataract extraction w/phaco Right 10/01/2015    Procedure: CATARACT EXTRACTION PHACO AND INTRAOCULAR LENS PLACEMENT (IOC);  Surgeon: Gemma Payor, MD;  Location: [facility];  Service: Ophthalmology;  Laterality: Right;  CDE:6.22   Family History  Problem Relation Age of Onset  . Depression Sister   . Alcohol abuse Brother   . Alcohol abuse Son   . Alcohol abuse Father    Social History  Substance Use Topics  . Smoking status: Never Smoker   . Smokeless tobacco: None  . Alcohol Use: No    Review of Systems ROS: Statement: All systems negative except as marked or noted in the HPI; Constitutional: Negative for fever  and chills. ; ; Eyes: Negative for eye pain, redness and discharge. ; ; ENMT: Negative for ear pain, hoarseness, nasal congestion, sinus pressure and sore throat. ; ; Cardiovascular: Negative for chest pain, palpitations, diaphoresis, dyspnea and peripheral edema. ; ; Respiratory: Negative for cough, wheezing and stridor. ; ; Gastrointestinal: Negative for nausea, vomiting, diarrhea, abdominal pain, blood in stool, hematemesis, jaundice and rectal bleeding. . ; ; Genitourinary: Negative for dysuria, flank pain and hematuria. ; ; Musculoskeletal: +head injury, back pain and neck pain. Negative for swelling and trauma.; ; Skin: Negative for pruritus, rash, abrasions, blisters, bruising and skin lesion.; ; Neuro: Negative for headache, lightheadedness and neck stiffness. Negative for weakness, altered level of consciousness, altered mental status, extremity weakness, paresthesias, involuntary movement, seizure and syncope.      Allergies  Cyclobenzaprine and Ultram  Home Medications   Prior to Admission medications   Medication Sig Start Date End Date Taking? Authorizing Provider  acetaminophen (TYLENOL) 650 MG CR tablet Take 650 mg by mouth every 6 (six) hours as needed for pain.   Yes Historical Provider, MD  ARIPiprazole (ABILIFY) 10 MG tablet Take 10 mg by mouth every other day.    Yes Historical Provider, MD  calcium-vitamin D (OSCAL WITH D) 500-200 MG-UNIT  tablet Take 1 tablet by mouth 2 (two) times daily.   Yes Historical Provider, MD  cholecalciferol (VITAMIN D) 1000 units tablet Take 1,000 Units by mouth daily.   Yes Historical Provider, MD  diazepam (VALIUM) 10 MG tablet Take 1 tablet (10 mg total) by mouth 2 (two) times daily. 12/02/15  Yes Myrlene Broker, MD  HYDROcodone-acetaminophen (NORCO/VICODIN) 5-325 MG tablet Take 1 tablet by mouth 2 (two) times daily as needed for moderate pain.   Yes Historical Provider, MD  levothyroxine (SYNTHROID, LEVOTHROID) 75 MCG tablet Take 75 mcg by mouth  daily before breakfast.   Yes Historical Provider, MD  metFORMIN (GLUCOPHAGE-XR) 500 MG 24 hr tablet Take 500 mg by mouth daily. 02/08/14  Yes Historical Provider, MD  mirtazapine (REMERON) 30 MG tablet Take 1 tablet (30 mg total) by mouth at bedtime. 12/02/15  Yes Myrlene Broker, MD  Multiple Vitamin (MULTIVITAMIN WITH MINERALS) TABS tablet Take 1 tablet by mouth daily.   Yes Historical Provider, MD  PARoxetine (PAXIL) 40 MG tablet Take 1 tablet (40 mg total) by mouth daily. 12/02/15 12/01/16 Yes Myrlene Broker, MD  promethazine (PHENERGAN) 12.5 MG tablet Take 12.5 mg by mouth daily as needed for nausea or vomiting.   Yes Historical Provider, MD  sennosides-docusate sodium (SENOKOT-S) 8.6-50 MG tablet Take 2 tablets by mouth at bedtime.   Yes Historical Provider, MD   BP 108/66 mmHg  Pulse 85  Temp(Src) 98.6 F (37 C) (Oral)  Resp 25  Ht 5\' 4"  (1.626 m)  Wt 164 lb (74.39 kg)  BMI 28.14 kg/m2  SpO2 95% Physical Exam  1140: Physical examination: Vital signs and O2 SAT: Reviewed; Constitutional: Well developed, Well nourished, Well hydrated, In no acute distress; Head and Face: Normocephalic, Atraumatic; Eyes: EOMI, PERRL, No scleral icterus; ENMT: Mouth and pharynx normal, Left TM normal, Right TM normal, Mucous membranes moist; Neck: Supple, Trachea midline; Spine: +mild TTP left lumbar paraspinal muscles. No midline CS, TS, LS tenderness.; Cardiovascular: Regular rate and rhythm, No gallop; Respiratory: Breath sounds clear & equal bilaterally, No wheezes, Normal respiratory effort/excursion; Chest: Nontender, No deformity, Movement normal, No crepitus, No abrasions or ecchymosis.; Abdomen: Soft, Nontender, Nondistended, Normal bowel sounds, No abrasions or ecchymosis.; Genitourinary: No CVA tenderness;; Extremities: No deformity, Full range of motion major/large joints of bilat UE's and LE's without pain or tenderness to palp, Neurovascularly intact, Pulses normal, No tenderness, No edema, Pelvis  stable; Neuro: AA&Ox3. +blind left eye per hx, otherwise major CN grossly intact. No facial droop. Speech clear. Grips equal. Strength 5/5 equal bilat UE's and LE's. No gross focal motor or sensory deficits in extremities.; Skin: Color normal, Warm, Dry   ED Course  Procedures (including critical care time) Labs Review  Imaging Review  I have personally reviewed and evaluated these images and lab results as part of my medical decision-making.   EKG Interpretation   Date/Time:  Friday February 26 2016 11:13:14 EDT Ventricular Rate:  90 PR Interval:    QRS Duration: 74 QT Interval:  483 QTC Calculation: 592 R Axis:   5 Text Interpretation:  Sinus rhythm Borderline low voltage, extremity leads  Prolonged QT interval When compared with ECG of 09/28/2015 QT has  lengthened Confirmed by [facility]  MD, Nicholos Johns [phone]) on 02/26/2016  12:03:03 PM      MDM  MDM Reviewed: previous chart, nursing note and vitals Reviewed previous: labs and ECG Interpretation: labs, ECG, x-ray and CT scan Total time providing critical care: 30-74 minutes. This excludes time  spent performing separately reportable procedures and services. Consults: neurosurgery and admitting MD     CRITICAL CARE Performed by: Laray AngerMCMANUS,Calder Oblinger M Total critical care time: 35 minutes Critical care time was exclusive of separately billable procedures and treating other patients. Critical care was necessary to treat or prevent imminent or life-threatening deterioration. Critical care was time spent personally by me on the following activities: development of treatment plan with patient and/or surrogate as well as nursing, discussions with consultants, evaluation of patient's response to treatment, examination of patient, obtaining history from patient or surrogate, ordering and performing treatments and interventions, ordering and review of laboratory studies, ordering and review of radiographic studies, pulse oximetry and  re-evaluation of patient's condition.   Results for orders placed or performed during the hospital encounter of 02/26/16  Basic metabolic panel  Result Value Ref Range   Sodium 139 135 - 145 mmol/L   Potassium 3.8 3.5 - 5.1 mmol/L   Chloride 105 101 - 111 mmol/L   CO2 25 22 - 32 mmol/L   Glucose, Bld 121 (H) 65 - 99 mg/dL   BUN 19 6 - 20 mg/dL   Creatinine, Ser 1.610.78 0.44 - 1.00 mg/dL   Calcium 9.2 8.9 - 09.610.3 mg/dL   GFR calc non Af Amer >60 >60 mL/min   GFR calc Af Amer >60 >60 mL/min   Anion gap 9 5 - 15  CBC  Result Value Ref Range   WBC 10.7 (H) 4.0 - 10.5 K/uL   RBC 4.51 3.87 - 5.11 MIL/uL   Hemoglobin 13.8 12.0 - 15.0 g/dL   HCT 04.542.8 40.936.0 - 81.146.0 %   MCV 94.9 78.0 - 100.0 fL   MCH 30.6 26.0 - 34.0 pg   MCHC 32.2 30.0 - 36.0 g/dL   RDW 91.413.7 78.211.5 - 95.615.5 %   Platelets 374 150 - 400 K/uL  Magnesium  Result Value Ref Range   Magnesium 1.9 1.7 - 2.4 mg/dL  Troponin I  Result Value Ref Range   Troponin I <0.03 <0.031 ng/mL   Dg Lumbar Spine Complete 02/26/2016  CLINICAL DATA:  Fall, back pain EXAM: LUMBAR SPINE - COMPLETE 4+ VIEW COMPARISON:  None FINDINGS: Mild levoscoliosis. Multilevel spondylosis. No fracture. Fecal impaction in the rectum. IMPRESSION: No acute findings Electronically Signed   By: Esperanza Heiraymond  Rubner M.D.   On: 02/26/2016 12:21   Ct Head Wo Contrast 02/26/2016  CLINICAL DATA:  Larey SeatFell at nursing home. Swelling to the back the head. Complaining of head and neck pain. Recent CT with left subdural hematoma. EXAM: CT HEAD WITHOUT CONTRAST CT CERVICAL SPINE WITHOUT CONTRAST TECHNIQUE: Multidetector CT imaging of the head and cervical spine was performed following the standard protocol without intravenous contrast. Multiplanar CT image reconstructions of the cervical spine were also generated. COMPARISON:  02/09/2016 FINDINGS: CT HEAD FINDINGS There has been a significant increase in size in the left subdural hematoma now measuring 19 mm greatest thickness, previously 13 mm.  Has also been a significant increase in mass effect. Midline structures are deviated to the right by a maximum of 18.6 mm. There is partial effacement of the lateral ventricles, left greater than right. Left cerebral gyri are flattened and sulci are partly effaced. The subdural has mixed attenuation reflecting combination of acute and subacute to chronic hemorrhage. No right subdural hemorrhage. No parenchymal or subarachnoid hemorrhage. There are no parenchymal masses. There is no evidence of an ischemic infarct. No skull fracture. The visualized sinuses and mastoid air cells are clear. CT CERVICAL SPINE  FINDINGS No fracture.  No spondylolisthesis. Straightening of the normal cervical lordosis. Moderate loss disc height at C5-C6 with moderate to marked loss of disc height at C6-C7. Small endplate spurs and mild endplate sclerosis. No bone lesion.  Bones are demineralized. Soft tissues are unremarkable. Lung apices show interstitial thickening and mild scarring. 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 Both the subdural hematoma and the mass effect have significantly increased from the prior head CT. No parenchymal or subarachnoid hemorrhage. No evidence of skull fracture. Critical Value/emergent results were called by telephone at the time of interpretation on 02/26/2016 at 12:31 pm to Dr. Samuel JesterKATHLEEN Clennon Nasca , who verbally acknowledged these results. CERVICAL CT:  No fracture or acute finding. Electronically Signed   By: Amie Portlandavid  Ormond M.D.   On: 02/26/2016 12:33   Ct Cervical Spine Wo Contrast 02/26/2016  CLINICAL DATA:  Larey SeatFell at nursing home. Swelling to the back the head. Complaining of head and neck pain. Recent CT with left subdural hematoma. EXAM: CT HEAD WITHOUT CONTRAST CT CERVICAL SPINE WITHOUT CONTRAST TECHNIQUE: Multidetector CT imaging of the head and cervical spine was performed following the standard protocol without intravenous  contrast. Multiplanar CT image reconstructions of the cervical spine were also generated. COMPARISON:  02/09/2016 FINDINGS: CT HEAD FINDINGS There has been a significant increase in size in the left subdural hematoma now measuring 19 mm greatest thickness, previously 13 mm. Has also been a significant increase in mass effect. Midline structures are deviated to the right by a maximum of 18.6 mm. There is partial effacement of the lateral ventricles, left greater than right. Left cerebral gyri are flattened and sulci are partly effaced. The subdural has mixed attenuation reflecting combination of acute and subacute to chronic hemorrhage. No right subdural hemorrhage. No parenchymal or subarachnoid hemorrhage. There are no parenchymal masses. There is no evidence of an ischemic infarct. No skull fracture. The visualized sinuses and mastoid air cells are clear. CT CERVICAL SPINE FINDINGS No fracture.  No spondylolisthesis. Straightening of the normal cervical lordosis. Moderate loss disc height at C5-C6 with moderate to marked loss of disc height at C6-C7. Small endplate spurs and mild endplate sclerosis. No bone lesion.  Bones are demineralized. Soft tissues are unremarkable. Lung apices show interstitial thickening and mild scarring. 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 Both the subdural hematoma and the mass effect have significantly increased from the prior head CT. No parenchymal or subarachnoid hemorrhage. No evidence of skull fracture. Critical Value/emergent results were called by telephone at the time of interpretation on 02/26/2016 at 12:31 pm to Dr. Samuel JesterKATHLEEN Young Brim , who verbally acknowledged these results. CERVICAL CT:  No fracture or acute finding. Electronically Signed   By: Amie Portlandavid  Ormond M.D.   On: 02/26/2016 12:33    1315:  Pt remains NPO. Has been talking with family at bedside, neuro exam unchanged. [facility]  Dx and testing d/w pt and family.  Questions answered.  Verb understanding. Pt's daughter is pt's POA. She denies pt is DNR.  [facility] Dr. Jordan LikesPool, case discussed, including:  HPI, pertinent PM/SHx, VS/PE, dx testing, ED course and treatment:  Agreeable to admit, requests to write temporary orders, obtain Neuro ICU bed.     Samuel JesterKathleen Alp Goldwater, DO 03/02/16 1240

## 2016-02-26 NOTE — Anesthesia Postprocedure Evaluation (Signed)
Anesthesia Post Note  Patient: Teresa Mccullough  Procedure(s) Performed: Procedure(s) (LRB): BURR HOLE (Left)  Patient location during evaluation: PACU Anesthesia Type: General Level of consciousness: awake and alert and patient cooperative Pain management: pain level controlled Vital Signs Assessment: post-procedure vital signs reviewed and stable Respiratory status: spontaneous breathing, nonlabored ventilation, respiratory function stable and patient connected to nasal cannula oxygen Cardiovascular status: blood pressure returned to baseline and stable Postop Assessment: no signs of nausea or vomiting Anesthetic complications: no    Last Vitals:  Filed Vitals:   02/26/16 2225 02/26/16 2253  BP: 144/71 135/78  Pulse: 103 94  Temp: 36.1 C   Resp: 16 16    Last Pain:  Filed Vitals:   02/26/16 2255  PainSc: 10-Worst pain ever                 Parker Sawatzky,E. Layne Dilauro

## 2016-02-26 NOTE — Op Note (Signed)
Date of procedure: 02/26/2016  Date of dictation: Same  Service: Neurosurgery  Preoperative diagnosis: Left subacute subdural hematoma, posttraumatic  Postoperative diagnosis: Same  Procedure Name: Left burr hole evacuation of subdural hematoma, placement of subdural drain  Surgeon:Anayansi Rundquist A.Marly Schuld, M.D.  Asst. Surgeon: None  Anesthesia: General  Indication: 75 year old female with worsening lethargy and had a tear cyst with falling. Workup demonstrates evidence of a large subacute subdural hematoma with mass effect along the left convexity. Patient presents now for burr hole evacuation.  Operative note: After induction anesthesia, patient position supine with head turned toward the right. Patient's left scalp prepped and draped sterilely. 2 incisions made after local lidocaine was infiltrated. One incision frontally 1 parietally. Retractor placed. Bur holes made. Dura: Angulated and then cut in a cruciate fashion. Dural leaflets burn back to the edges. Subdural fluid drained out under pressure with gentle irrigation as well. The brain reexpanded nicely. 10 mm drain passed from parietal burr hole past frontal bur hole under direct visualization. Drain exited through separate stab incision. Wounds irrigated and then closed in typical fashion. Sterile dressing was applied. No apparent complications. Patient tolerated procedure well and she returns to the recovery room postop.

## 2016-02-26 NOTE — ED Notes (Addendum)
Called Receiving Unit to notify of pt departure with [facility].   Pt attempting to get out of bed and reporting needs to urinate. Pt given bed pan. Pt unable to void. [facility] inquired about a foley. [facility] explained that pt does not meet criteria for foley at this time per ED protocol. EDP aware and at bedside to discuss priority for this patient is to be transferred and not foley with pt/pt family/[facility].

## 2016-02-26 NOTE — ED Notes (Signed)
[place] [facility] would be at [facility] in approx 20 minutes.

## 2016-02-26 NOTE — ED Notes (Signed)
[facility] c/o [facility]. Per EMS patient fell while trying to get something out of the floor. Patient has swelling to posterior head. Patient states pain to head and neck.

## 2016-02-26 NOTE — Brief Op Note (Signed)
02/26/2016  10:14 PM  PATIENT:  Violeta GelinasLucille J Habeeb  75 y.o. female  PRE-OPERATIVE DIAGNOSIS:  Left subacute posttraumatic subdural hematoma  POST-OPERATIVE DIAGNOSIS:  Left subacute posttraumatic subdural hematoma  PROCEDURE:  Procedure(s): BURR HOLE (Left)  SURGEON:  Surgeon(s) and Role:    * Julio SicksHenry Anthoni Geerts, MD - Primary  PHYSICIAN ASSISTANT:   ASSISTANTS:    ANESTHESIA:   general  EBL:  Total I/O In: 75 [I.V.:75] Out: 320 [Urine:290; Blood:30]  BLOOD ADMINISTERED:none  DRAINS: (10 mm) Blake drain(s) in the subdural space   LOCAL MEDICATIONS USED:  LIDOCAINE   SPECIMEN:  No Specimen  DISPOSITION OF SPECIMEN:  N/A  COUNTS:  YES  TOURNIQUET:  * No tourniquets in log *  DICTATION: .Dragon Dictation  PLAN OF CARE: Admit to inpatient   PATIENT DISPOSITION:  PACU - hemodynamically stable.   Delay start of Pharmacological VTE agent (>24hrs) due to surgical blood loss or risk of bleeding: yes

## 2016-02-26 NOTE — Anesthesia Procedure Notes (Signed)
Procedure Name: Intubation Date/Time: 02/26/2016 9:38 PM Performed by: Julianne RiceBILOTTA, Teresa Hatchel Z Pre-anesthesia Checklist: Patient identified, Timeout performed, Emergency Drugs available, Suction available and Patient being monitored Patient Re-evaluated:Patient Re-evaluated prior to inductionOxygen Delivery Method: Circle system utilized Preoxygenation: Pre-oxygenation with 100% oxygen Intubation Type: IV induction Ventilation: Mask ventilation without difficulty Laryngoscope Size: Mac and 3 Grade View: Grade I Tube type: Oral Tube size: 7.5 mm Number of attempts: 1 Airway Equipment and Method: Stylet Placement Confirmation: ETT inserted through vocal cords under direct vision,  breath sounds checked- equal and bilateral and positive ETCO2 Secured at: 22 cm Tube secured with: Tape Dental Injury: Teeth and Oropharynx as per pre-operative assessment

## 2016-02-26 NOTE — Anesthesia Preprocedure Evaluation (Addendum)
Anesthesia Evaluation  Patient identified by MRN, date of birth, ID band Patient confused  General Assessment Comment:A little somnolent  Reviewed: Allergy & Precautions, NPO status , Patient's Chart, lab work & pertinent test results  History of Anesthesia Complications Negative for: history of anesthetic complications  Airway Mallampati: II  TM Distance: >3 FB Neck ROM: Full    Dental  (+) Edentulous Upper, Edentulous Lower   Pulmonary neg pulmonary ROS,    breath sounds clear to auscultation       Cardiovascular negative cardio ROS   Rhythm:Regular Rate:Normal     Neuro/Psych Anxiety Depression Subdural hematoma: lethargic    GI/Hepatic negative GI ROS, Neg liver ROS,   Endo/Other  diabetes (glu 85), Oral Hypoglycemic AgentsHypothyroidism   Renal/GU negative Renal ROS     Musculoskeletal   Abdominal   Peds  Hematology negative hematology ROS (+)   Anesthesia Other Findings   Reproductive/Obstetrics                           Anesthesia Physical Anesthesia Plan  ASA: III  Anesthesia Plan: General   Post-op Pain Management:    Induction: Intravenous  Airway Management Planned: Oral ETT  Additional Equipment:   Intra-op Plan:   Post-operative Plan: Possible Post-op intubation/ventilation  Informed Consent: I have reviewed the patients History and Physical, chart, labs and discussed the procedure including the risks, benefits and alternatives for the proposed anesthesia with the patient or authorized representative who has indicated his/her understanding and acceptance.     Plan Discussed with: CRNA and Surgeon  Anesthesia Plan Comments: (Plan routine monitors, GETA)        Anesthesia Quick Evaluation

## 2016-02-26 NOTE — Transfer of Care (Signed)
Immediate Anesthesia Transfer of Care Note  Patient: Teresa Mccullough  Procedure(s) Performed: Procedure(s): BURR HOLE (Left)  Patient Location: PACU  Anesthesia Type:General  Level of Consciousness: awake  Airway & Oxygen Therapy: Patient Spontanous Breathing and Patient connected to face mask oxygen  Post-op Assessment: Report given to RN and Post -op Vital signs reviewed and stable  Post vital signs: Reviewed and stable  Last Vitals:  Filed Vitals:   02/26/16 1915 02/26/16 2000  BP:  99/51  Pulse:  65  Temp: 37 C   Resp:  19    Last Pain:  Filed Vitals:   02/26/16 2021  PainSc: 10-Worst pain ever         Complications: No apparent anesthesia complications

## 2016-02-26 NOTE — H&P (Signed)
Teresa Mccullough is an 75 y.o. female.   Chief Complaint: Weakness and falling HPI: 75 year old female admitted for evaluation of left posttraumatic subacute subdural hematoma. Patient has been suffering numerous falls with a significant fall proximally 2 weeks ago. Patient was hospitalized at [facility] for evaluation of an acute left convexity subdural hematoma. She was managed nonoperatively and discharged to a skilled nursing facility for further convalescence. Since that time the patient has not thrived. She has developed increasing weakness and lethargy. She has suffered another couple of falls. She was taken to an another emergency room today for further evaluation. Follow-up head CT scan demonstrates evolution of her left-sided subdural hematoma with increasing fluid component and mass effect. Patient without history of seizure. She is somewhat lethargic and blunted. She is a poor historian secondary to her condition. Past medical history is otherwise reasonably mild aside from type 2 diabetes.  Past Medical History  Diagnosis Date  . Thyroid disease   . Diabetes mellitus, type II ([place])   . Constipation   . GERD (gastroesophageal reflux disease)   . Back pain   . Depression   . Anxiety     Past Surgical History  Procedure Laterality Date  . Abdominal hysterectomy    . Ulcer surgery    . Goiter removed    . Cataract extraction w/phaco Right 10/01/2015    Procedure: CATARACT EXTRACTION PHACO AND INTRAOCULAR LENS PLACEMENT (IOC);  Surgeon: Tonny Branch, MD;  Location: [facility];  Service: Ophthalmology;  Laterality: Right;  CDE:6.22    Family History  Problem Relation Age of Onset  . Depression Sister   . Alcohol abuse Brother   . Alcohol abuse Son   . Alcohol abuse Father    Social History:  reports that she has never smoked. She does not have any smokeless tobacco history on file. She reports that she does not drink alcohol or use illicit drugs.  Allergies:  Allergies   Allergen Reactions  . Cyclobenzaprine     hallucinations  . Ultram [Tramadol]     hallucinations    Medications Prior to Admission  Medication Sig Dispense Refill  . acetaminophen (TYLENOL) 650 MG CR tablet Take 650 mg by mouth every 6 (six) hours as needed for pain.    . ARIPiprazole (ABILIFY) 10 MG tablet Take 10 mg by mouth every other day.     . calcium-vitamin D (OSCAL WITH D) 500-200 MG-UNIT tablet Take 1 tablet by mouth 2 (two) times daily.    . cholecalciferol (VITAMIN D) 1000 units tablet Take 1,000 Units by mouth daily.    . diazepam (VALIUM) 10 MG tablet Take 1 tablet (10 mg total) by mouth 2 (two) times daily. 60 tablet 2  . HYDROcodone-acetaminophen (NORCO/VICODIN) 5-325 MG tablet Take 1 tablet by mouth 2 (two) times daily as needed for moderate pain.    Marland Kitchen levothyroxine (SYNTHROID, LEVOTHROID) 75 MCG tablet Take 75 mcg by mouth daily before breakfast.    . metFORMIN (GLUCOPHAGE-XR) 500 MG 24 hr tablet Take 500 mg by mouth daily.    . mirtazapine (REMERON) 30 MG tablet Take 1 tablet (30 mg total) by mouth at bedtime. 30 tablet 2  . Multiple Vitamin (MULTIVITAMIN WITH MINERALS) TABS tablet Take 1 tablet by mouth daily.    Marland Kitchen PARoxetine (PAXIL) 40 MG tablet Take 1 tablet (40 mg total) by mouth daily. 30 tablet 2  . promethazine (PHENERGAN) 12.5 MG tablet Take 12.5 mg by mouth daily as needed for nausea or vomiting.    Marland Kitchen  sennosides-docusate sodium (SENOKOT-S) 8.6-50 MG tablet Take 2 tablets by mouth at bedtime.      Results for orders placed or performed during the hospital encounter of 02/26/16 (from the past 48 hour(s))  Magnesium     Status: None   Collection Time: 02/26/16 11:48 AM  Result Value Ref Range   Magnesium 1.9 1.7 - 2.4 mg/dL  Troponin I     Status: None   Collection Time: 02/26/16 11:48 AM  Result Value Ref Range   Troponin I <0.03 <0.031 ng/mL    Comment:        NO INDICATION OF MYOCARDIAL INJURY.   Basic metabolic panel     Status: Abnormal    Collection Time: 02/26/16 11:49 AM  Result Value Ref Range   Sodium 139 135 - 145 mmol/L   Potassium 3.8 3.5 - 5.1 mmol/L   Chloride 105 101 - 111 mmol/L   CO2 25 22 - 32 mmol/L   Glucose, Bld 121 (H) 65 - 99 mg/dL   BUN 19 6 - 20 mg/dL   Creatinine, Ser 0.78 0.44 - 1.00 mg/dL   Calcium 9.2 8.9 - 10.3 mg/dL   GFR calc non Af Amer >60 >60 mL/min   GFR calc Af Amer >60 >60 mL/min    Comment: (NOTE) The eGFR has been calculated using the CKD EPI equation. This calculation has not been validated in all clinical situations. eGFR's persistently <60 mL/min signify possible Chronic Kidney Disease.    Anion gap 9 5 - 15  CBC     Status: Abnormal   Collection Time: 02/26/16 11:49 AM  Result Value Ref Range   WBC 10.7 (H) 4.0 - 10.5 K/uL   RBC 4.51 3.87 - 5.11 MIL/uL   Hemoglobin 13.8 12.0 - 15.0 g/dL   HCT 42.8 36.0 - 46.0 %   MCV 94.9 78.0 - 100.0 fL   MCH 30.6 26.0 - 34.0 pg   MCHC 32.2 30.0 - 36.0 g/dL   RDW 13.7 11.5 - 15.5 %   Platelets 374 150 - 400 K/uL   Dg Lumbar Spine Complete  02/26/2016  CLINICAL DATA:  Fall, back pain EXAM: LUMBAR SPINE - COMPLETE 4+ VIEW COMPARISON:  None FINDINGS: Mild levoscoliosis. Multilevel spondylosis. No fracture. Fecal impaction in the rectum. IMPRESSION: No acute findings Electronically Signed   By: Skipper Cliche M.D.   On: 02/26/2016 12:21   Ct Head Wo Contrast  02/26/2016  CLINICAL DATA:  [facility] at nursing home. Swelling to the back the head. Complaining of head and neck pain. Recent CT with left subdural hematoma. EXAM: CT HEAD WITHOUT CONTRAST CT CERVICAL SPINE WITHOUT CONTRAST TECHNIQUE: Multidetector CT imaging of the head and cervical spine was performed following the standard protocol without intravenous contrast. Multiplanar CT image reconstructions of the cervical spine were also generated. COMPARISON:  02/09/2016 FINDINGS: CT HEAD FINDINGS There has been a significant increase in size in the left subdural hematoma now measuring 19 mm  greatest thickness, previously 13 mm. Has also been a significant increase in mass effect. Midline structures are deviated to the right by a maximum of 18.6 mm. There is partial effacement of the lateral ventricles, left greater than right. Left cerebral gyri are flattened and sulci are partly effaced. The subdural has mixed attenuation reflecting combination of acute and subacute to chronic hemorrhage. No right subdural hemorrhage. No parenchymal or subarachnoid hemorrhage. There are no parenchymal masses. There is no evidence of an ischemic infarct. No skull fracture. The visualized sinuses and mastoid  air cells are clear. CT CERVICAL SPINE FINDINGS No fracture.  No spondylolisthesis. Straightening of the normal cervical lordosis. Moderate loss disc height at C5-C6 with moderate to marked loss of disc height at C6-C7. Small endplate spurs and mild endplate sclerosis. No bone lesion.  Bones are demineralized. Soft tissues are unremarkable. Lung apices show interstitial thickening and mild scarring. 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 Both the subdural hematoma and the mass effect have significantly increased from the prior head CT. No parenchymal or subarachnoid hemorrhage. No evidence of skull fracture. Critical Value/emergent results were called by telephone at the time of interpretation on 02/26/2016 at 12:31 pm to Dr. Francine Graven , who verbally acknowledged these results. CERVICAL CT:  No fracture or acute finding. Electronically Signed   By: Lajean Manes M.D.   On: 02/26/2016 12:33   Ct Cervical Spine Wo Contrast  02/26/2016  CLINICAL DATA:  [facility] at nursing home. Swelling to the back the head. Complaining of head and neck pain. Recent CT with left subdural hematoma. EXAM: CT HEAD WITHOUT CONTRAST CT CERVICAL SPINE WITHOUT CONTRAST TECHNIQUE: Multidetector CT imaging of the head and cervical spine was performed following  the standard protocol without intravenous contrast. Multiplanar CT image reconstructions of the cervical spine were also generated. COMPARISON:  02/09/2016 FINDINGS: CT HEAD FINDINGS There has been a significant increase in size in the left subdural hematoma now measuring 19 mm greatest thickness, previously 13 mm. Has also been a significant increase in mass effect. Midline structures are deviated to the right by a maximum of 18.6 mm. There is partial effacement of the lateral ventricles, left greater than right. Left cerebral gyri are flattened and sulci are partly effaced. The subdural has mixed attenuation reflecting combination of acute and subacute to chronic hemorrhage. No right subdural hemorrhage. No parenchymal or subarachnoid hemorrhage. There are no parenchymal masses. There is no evidence of an ischemic infarct. No skull fracture. The visualized sinuses and mastoid air cells are clear. CT CERVICAL SPINE FINDINGS No fracture.  No spondylolisthesis. Straightening of the normal cervical lordosis. Moderate loss disc height at C5-C6 with moderate to marked loss of disc height at C6-C7. Small endplate spurs and mild endplate sclerosis. No bone lesion.  Bones are demineralized. Soft tissues are unremarkable. Lung apices show interstitial thickening and mild scarring. 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 Both the subdural hematoma and the mass effect have significantly increased from the prior head CT. No parenchymal or subarachnoid hemorrhage. No evidence of skull fracture. Critical Value/emergent results were called by telephone at the time of interpretation on 02/26/2016 at 12:31 pm to Dr. Francine Graven , who verbally acknowledged these results. CERVICAL CT:  No fracture or acute finding. Electronically Signed   By: Lajean Manes M.D.   On: 02/26/2016 12:33    Pertinent items noted in HPI and remainder of comprehensive ROS  otherwise negative.  Blood pressure 132/64, pulse 82, temperature 98.7 F (37.1 C), temperature source Oral, resp. rate 19, height '5\' 4"'  (1.626 m), weight 74.39 kg (164 lb), SpO2 95 %.  The patient is awake and aware. She has blunted and minimally verbal. Her speech is slow and her content is moderately confused. She is oriented to person and place and situation. Examination her cranial nerve function demonstrates no cranial nerve abnormalities at present. Motor examination reveals 5 over 5 strength on the left. She has 4+  over 5 strength on the right. Sensory examination is nonfocal. Examination head ears eyes and throat is unremarkable. Chest and abdomen are benign. Extremities are free from injury deformity. 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 I discussed situation with patient and her family. I recommend that we proceed with burr hole evacuation of subdural hematoma. I discussed the risks and benefits involved with surgery including but not limited to the risk of anesthesia, bleeding, infection, brain injury including stroke, coma, and even death. I discussed the risk of seizure and need for reoperation. The patient and her family are aware. They wish to proceed.  Silvina Hackleman A 02/26/2016, 4:18 PM

## 2016-02-26 NOTE — ED Notes (Signed)
EDP at bedside  

## 2016-02-26 NOTE — ED Notes (Signed)
Pt was given an occupied bed at 2S room 6. Pt has now been assigned bed 1.

## 2016-02-27 ENCOUNTER — Inpatient Hospital Stay (HOSPITAL_COMMUNITY): Payer: Medicare Other

## 2016-02-27 LAB — BASIC METABOLIC PANEL
ANION GAP: 6 (ref 5–15)
BUN: 15 mg/dL (ref 6–20)
CALCIUM: 8.4 mg/dL — AB (ref 8.9–10.3)
CO2: 24 mmol/L (ref 22–32)
CREATININE: 0.66 mg/dL (ref 0.44–1.00)
Chloride: 108 mmol/L (ref 101–111)
Glucose, Bld: 123 mg/dL — ABNORMAL HIGH (ref 65–99)
Potassium: 3.8 mmol/L (ref 3.5–5.1)
Sodium: 138 mmol/L (ref 135–145)

## 2016-02-27 LAB — CBC
HCT: 36.4 % (ref 36.0–46.0)
HEMOGLOBIN: 11.3 g/dL — AB (ref 12.0–15.0)
MCH: 29.4 pg (ref 26.0–34.0)
MCHC: 31 g/dL (ref 30.0–36.0)
MCV: 94.8 fL (ref 78.0–100.0)
PLATELETS: 301 10*3/uL (ref 150–400)
RBC: 3.84 MIL/uL — AB (ref 3.87–5.11)
RDW: 14 % (ref 11.5–15.5)
WBC: 9.1 10*3/uL (ref 4.0–10.5)

## 2016-02-27 LAB — GLUCOSE, CAPILLARY
GLUCOSE-CAPILLARY: 111 mg/dL — AB (ref 65–99)
Glucose-Capillary: 134 mg/dL — ABNORMAL HIGH (ref 65–99)
Glucose-Capillary: 102 mg/dL — ABNORMAL HIGH (ref 65–99)

## 2016-02-27 MED ORDER — INSULIN ASPART 100 UNIT/ML ~~LOC~~ SOLN
0.0000 [IU] | Freq: Three times a day (TID) | SUBCUTANEOUS | Status: DC
  Administered 2016-02-28: 2 [IU] via SUBCUTANEOUS

## 2016-02-27 MED ORDER — DIAZEPAM 2 MG PO TABS
2.0000 mg | ORAL_TABLET | Freq: Two times a day (BID) | ORAL | Status: DC
  Administered 2016-02-27 – 2016-03-02 (×8): 2 mg via ORAL
  Filled 2016-02-27 (×8): qty 1

## 2016-02-27 NOTE — Progress Notes (Signed)
Postop day 1. Patient without complaints of headaches. No problems overnight.  Patient is afebrile. Her vitals are stable. Drain output low with dark bloody drainage. Patient is awake and alert. She is oriented and reasonably appropriate. Her speech is fluent. Motor examination reveals improvement of her right hammock [place] is. Her wound is dry. Chest and abdomen benign.  Follow-up head CT scan demonstrates improved appearance of her subdural hematoma status post drainage. Still with some overlying chronic blood products. Mass effect much improved.  Status post bur hole evacuation of subdural hematoma. Begin to mobilize. Continue drain. Follow-up head CT scan on Monday.

## 2016-02-28 LAB — GLUCOSE, CAPILLARY
GLUCOSE-CAPILLARY: 104 mg/dL — AB (ref 65–99)
GLUCOSE-CAPILLARY: 145 mg/dL — AB (ref 65–99)
Glucose-Capillary: 119 mg/dL — ABNORMAL HIGH (ref 65–99)
Glucose-Capillary: 127 mg/dL — ABNORMAL HIGH (ref 65–99)

## 2016-02-28 NOTE — Progress Notes (Signed)
Bladder scan done 591cc I&O cath done 700 cc drained. We will continue to monitor.

## 2016-02-28 NOTE — Progress Notes (Signed)
Postop day 2. Patient brighter with more fluent speech. No hemiparesis. Wound clean and dry. Drain output moderate. Continues to drain dark, old blood.  Vitals are stable. She is afebrile.  Doing well status post burr hole evacuation of subdural. Follow-up head CT scan tomorrow and hopeful removal of drain.

## 2016-02-29 ENCOUNTER — Encounter (HOSPITAL_COMMUNITY): Payer: Self-pay | Admitting: Neurosurgery

## 2016-02-29 ENCOUNTER — Inpatient Hospital Stay (HOSPITAL_COMMUNITY): Payer: Medicare Other

## 2016-02-29 LAB — GLUCOSE, CAPILLARY
GLUCOSE-CAPILLARY: 99 mg/dL (ref 65–99)
GLUCOSE-CAPILLARY: 102 mg/dL — AB (ref 65–99)
Glucose-Capillary: 120 mg/dL — ABNORMAL HIGH (ref 65–99)
Glucose-Capillary: 105 mg/dL — ABNORMAL HIGH (ref 65–99)

## 2016-02-29 NOTE — Progress Notes (Signed)
Occupational Therapy Evaluation Patient Details Name: Teresa Mccullough MRN: [id number] DOB: 04/04/1941 Today's Date: 02/29/2016    History of Present Illness 75 yo female admitted for evaluation of left posttraumatic subacute subdural hematoma. Patient has been suffering numerous falls with a significant fall proximally 2 weeks ago. [facility] for evaluation of an acute left convexity subdural hematoma. Pt went to SNF for rehab, experienced more falls and now admitted fir left burr hole evacutation and placement of subdural drain.   Clinical Impression   PTA, pt was participating in rehab at a SNF. Prior to that, pt lived at home and was independent with ADL and mobility. Pt's goal is to return to independent living again. Eval limited by nsg requesting pt return to supine due to CT results. Pt will need to return to SNF for continued rehab. Will follow acutely to address established goals.     Follow Up Recommendations  SNF;Supervision/Assistance - 24 hour    Equipment Recommendations  Other (comment) (TBa at SNF)    Recommendations for Other Services       Precautions / Restrictions Precautions Precautions: Fall Precaution Comments: jp drain in place subdural Restrictions Weight Bearing Restrictions: No      Mobility Bed Mobility Overal bed mobility: Needs Assistance Bed Mobility: Supine to Sit;Sit to Supine     Supine to sit: Min assist Sit to supine: Min guard   General bed mobility comments: Increased multi-modal cues for initiation, increased time to perform, min assist to come to EOB, min guard to return to bed.   Transfers                 General transfer comment: did not perform (per nsg, hold OOB)    Balance Overall balance assessment: Needs assistance Sitting-balance support: Feet supported Sitting balance-Leahy Scale: Fair Sitting balance - Comments: Ablet o sustain midline orientaiton once positioned at midline.  Did not attempt to self correct while leaning to R Postural control: Right lateral lean                                  ADL Overall ADL's : Needs assistance/impaired     Grooming: Set up;Supervision/safety;Sitting (EOB)   Upper Body Bathing: Supervision/ safety;Set up;Sitting (EOB)                             General ADL Comments: Limited assessment of ADL due to medical status     Vision Vision Assessment?: Vision impaired- to be further tested in functional context Additional Comments: decreased visual attention. Will further assess   Perception     Praxis Praxis Praxis tested?: Deficits Deficits: Initiation Praxis-Other Comments: delay with all processing    Pertinent Vitals/Pain Pain Assessment: No/denies pain     Hand Dominance Right   Extremity/Trunk Assessment Upper Extremity Assessment Upper Extremity Assessment: Generalized weakness   Lower Extremity Assessment Lower Extremity Assessment: Defer to PT evaluation       Communication Communication Communication: Expressive difficulties (some word finding difficulty noted)   Cognition Arousal/Alertness: Awake/alert Behavior During Therapy: Flat affect Overall Cognitive Status: Impaired/Different from baseline Area of Impairment: Attention;Memory;Following commands;Safety/judgement;Awareness;Problem solving   Current Attention Level: Sustained Memory: Decreased short-term memory Following Commands: Follows one step commands with increased time Safety/Judgement: Decreased awareness of safety;Decreased awareness of deficits Awareness: Intellectual Problem Solving: Slow processing;Decreased initiation;Difficulty sequencing;Requires verbal cues;Requires tactile cues  General Comments       Exercises       Shoulder Instructions      Home Living Family/patient expects to be discharged to:: Skilled nursing facility                                         Prior Functioning/Environment          Comments: [facility] following recent fall with SDH    OT Diagnosis: Generalized weakness;Cognitive deficits;Disturbance of vision   OT Problem List: Decreased strength;Decreased activity tolerance;Impaired balance (sitting and/or standing);Impaired vision/perception;Decreased coordination;Decreased cognition;Decreased safety awareness;Decreased knowledge of use of DME or AE;Decreased knowledge of precautions   OT Treatment/Interventions: Self-care/ADL training;Therapeutic exercise;Neuromuscular education;DME and/or AE instruction;Therapeutic activities;Cognitive remediation/compensation;Patient/family education;Balance training    OT Goals(Current goals can be found in the care plan section) Acute Rehab OT Goals Patient Stated Goal: to be able to live alone again OT Goal Formulation: With patient Time For Goal Achievement: 03/14/16 Potential to Achieve Goals: Good ADL Goals Pt Will Perform Lower Body Bathing: with supervision;with set-up;sit to/from stand Pt Will Perform Lower Body Dressing: with set-up;with supervision;sit to/from stand Pt Will Transfer to Toilet: with min guard assist;ambulating;bedside commode Pt Will Perform Toileting - Clothing Manipulation and hygiene: with supervision;sit to/from stand Additional ADL Goal #1: Pt will complete siple grooming ADL task with minimally distracting environment without redicrectional cues.  OT Frequency: Min 2X/week   Barriers to D/C:            Co-evaluation   Reason for Co-Treatment: Complexity of the patient's impairments (multi-system involvement);Necessary to address cognition/behavior during functional activity          End of Session Nurse Communication: Mobility status  Activity Tolerance: Treatment limited secondary to medical complications (Comment) (notified by nsg mid session of resuts of CT; held OOB) Patient left: in bed;with call bell/phone within  reach;with bed alarm set   Time: 1021-1043 OT Time Calculation (min): 22 min Charges:  OT General Charges $OT Visit: 1 Procedure OT Evaluation $OT Eval Moderate Complexity: 1 Procedure G-Codes:    Robley Matassa,HILLARY 02/29/2016, 1:33 PM   [facility] Thales Knipple, OTR/L  [phone] 02/29/2016

## 2016-02-29 NOTE — Progress Notes (Signed)
PT Cancellation Note  Patient Details Name: Teresa Mccullough MRN: [id number] DOB: 07/22/1941   Cancelled Treatment:    Reason Eval/Treat Not Completed: Patient not medically ready (bedrest orders at this time)   Fabio AsaWerner, Ambrie Carte J 02/29/2016, 8:01 AM  Charlotte Crumbevon Vlad Mayberry, PT DPT  [phone]

## 2016-02-29 NOTE — Evaluation (Signed)
Physical Therapy Evaluation Patient Details Name: Teresa Mccullough MRN: [id number] DOB: 10/29/1940 Today's Date: 02/29/2016   History of Present Illness  75 yo female admitted for evaluation of left posttraumatic subacute subdural hematoma. Patient has been suffering numerous falls with a significant fall proximally 2 weeks ago. [facility] for evaluation of an acute left convexity subdural hematoma. Pt went to SNF for rehab, experienced more falls and now admitted fir left burr hole evacutation and placement of subdural drain.  Clinical Impression  Patient demonstrates deficits in functional mobility as indicated below. Will need continued skilled PT to address deficits and maximize function. Will see as indicated and progress as tolerated. OF NOTE: limited evaluation, tolerated EOB BP stable 120s/ 70s. Nsg notified therapist mid session of newly received CT results, OOB held. Will await further clearance to proceed with increased activity.    Follow Up Recommendations SNF    Equipment Recommendations   (TBD)    Recommendations for Other Services       Precautions / Restrictions Precautions Precautions: Fall Precaution Comments: drain in place subdural Restrictions Weight Bearing Restrictions: No      Mobility  Bed Mobility Overal bed mobility: Needs Assistance Bed Mobility: Supine to Sit;Sit to Supine     Supine to sit: Min assist Sit to supine: Min guard   General bed mobility comments: Increased multi-modal cues for initiation, increased time to perform, min assist to come to EOB, min guard to return to bed.   Transfers                 General transfer comment: did not perform (per nsg, hold OOB)  Ambulation/Gait             General Gait Details: did not perform (per nsg, hold OOB)  Stairs            Wheelchair Mobility    Modified Rankin (Stroke Patients Only) Modified Rankin (Stroke Patients Only) Pre-Morbid  Rankin Score: Moderately severe disability Modified Rankin: Severe disability     Balance Overall balance assessment: Needs assistance;History of Falls Sitting-balance support: Feet supported Sitting balance-Leahy Scale: Poor   Postural control: Right lateral lean                                   Pertinent Vitals/Pain Pain Assessment: No/denies pain    Home Living Family/patient expects to be discharged to:: Skilled nursing facility                      Prior Function           Comments: [facility] recent fall with SDH     Hand Dominance        Extremity/Trunk Assessment               Lower Extremity Assessment: Generalized weakness         Communication   Communication: Expressive difficulties (some word finding difficulty noted)  Cognition Arousal/Alertness: Awake/alert Behavior During Therapy: Flat affect Overall Cognitive Status: Impaired/Different from baseline Area of Impairment: Attention;Memory;Following commands;Safety/judgement;Awareness;Problem solving   Current Attention Level: Sustained Memory: Decreased short-term memory Following Commands: Follows one step commands with increased time Safety/Judgement: Decreased awareness of safety;Decreased awareness of deficits Awareness: Intellectual Problem Solving: Slow processing;Decreased initiation;Difficulty sequencing;Requires verbal cues;Requires tactile cues      General Comments      Exercises  Assessment/Plan    PT Assessment Patient needs continued PT services  PT Diagnosis Difficulty walking;Abnormality of gait;Generalized weakness;Altered mental status   PT Problem List Decreased strength;Decreased activity tolerance;Decreased balance;Decreased mobility;Decreased coordination;Decreased cognition;Decreased knowledge of use of DME;Decreased safety awareness  PT Treatment Interventions DME instruction;Gait training;Functional  mobility training;Therapeutic activities;Therapeutic exercise;Balance training;Neuromuscular re-education;Cognitive remediation;Patient/family education   PT Goals (Current goals can be found in the Care Plan section) Acute Rehab PT Goals Patient Stated Goal: none stated PT Goal Formulation: With patient Time For Goal Achievement: 03/14/16 Potential to Achieve Goals: Good    Frequency Min 3X/week   Barriers to discharge        Co-evaluation PT/OT/SLP Co-Evaluation/Treatment: Yes Reason for Co-Treatment: Complexity of the patient's impairments (multi-system involvement);Necessary to address cognition/behavior during functional activity           End of Session   Activity Tolerance: Other (comment) (CT results obtained mid session, nsg requests to hold OOB) Patient left: in bed;with call bell/phone within reach;with bed alarm set Nurse Communication: Mobility status         Time: 1610-96041021-1043 PT Time Calculation (min) (ACUTE ONLY): 22 min   Charges:   PT Evaluation $PT Eval Moderate Complexity: 1 Procedure     PT G CodesFabio Asa:        Ferne Ellingwood J 02/29/2016, 11:00 AM Charlotte Crumbevon Manvir Prabhu, PT DPT  [phone]

## 2016-02-29 NOTE — Care Management Note (Signed)
Case Management Note  Patient Details  Name: Teresa Mccullough MRN: [id number] Date of Birth: 02/15/1941  Subjective/Objective:   left posttraumatic subacute subdural hematoma                 Action/Plan: Discharge Planning:   NCM spoke to pt's grand-dtr, Lenn SinkSamantha Johnson, # [phone]. [facility]-dtr prefers [facility]. States she does not want her to dc back [facility]. [facility].   Expected Discharge Date:               Expected Discharge Plan:  Skilled Nursing Facility  In-House Referral:  Clinical Social Work  Discharge planning Services  CM Consult  Post Acute Care Choice:  NA Choice offered to:  NA  DME Arranged:  N/A DME Agency:  NA  HH Arranged:  NA HH Agency:  NA  Status of Service:  Completed, signed off  If discussed at Long Length of Stay Meetings, dates discussed:    Additional Comments:  Elliot CousinShavis, Neomi Laidler Ellen, RN 02/29/2016, 2:43 PM

## 2016-02-29 NOTE — Clinical Social Work Note (Signed)
Clinical Social Work Assessment  Patient Details  Name: Teresa GelinasLucille J Vinciguerra MRN: [id number] Date of Birth: 04/03/1941  Date of referral:  02/29/16               Reason for consult:  Facility Placement                Permission sought to share information with:  Facility Medical sales representativeContact Representative, Family Supports Permission granted to share information::  Yes, Verbal Permission Granted  Name::     Data processing manageramantha  Agency::  SNFs  Relationship::  Advertising account executiveGranddaughter  Contact Information:     Housing/Transportation Living arrangements for the past 2 months:  Skilled Nursing Facility, Single Family Home Source of Information:  Patient, Other (Comment Required) (Granddaughter) Patient Interpreter Needed:  None Criminal Activity/Legal Involvement Pertinent to Current Situation/Hospitalization:  No - Comment as needed Significant Relationships:  Adult Children Lives with:  Self Do you feel safe going back to the place where you live?  No Need for family participation in patient care:  Yes (Comment)  Care giving concerns:  CSW received referral for possible SNF placement at time of discharge. CSW spoke with patient's granddaughter, Lelon MastSamantha, regarding PT recommendation of SNF placement at time of discharge. Per patient's granddaughter, patient is currently unable to care for patient at home given patient's current physical needs and fall risk. Patient and patient's granddaughter expressed understanding of PT recommendation and are agreeable to SNF placement at time of discharge. CSW to continue to follow and assist with discharge planning needs.   Social Worker assessment / plan:  CSW spoke with patient and patient's granddaughter concerning [facility] before returning home.  Employment status:  Retired Database administratornsurance information:  Managed Medicare PT Recommendations:  Skilled Nursing Facility Information / Referral to community resources:  Skilled Nursing Facility  Patient/Family's Response to  care:  Patient and patient's granddaughter recognize need for rehab before returning home and are agreeable to a SNF. [facility] reported [facility] and do not want patient to go back [facility].  Patient/Family's Understanding of and Emotional Response to Diagnosis, Current Treatment, and Prognosis:  Patient is realistic regarding therapy needs. No questions/concerns about plan or treatment.    Emotional Assessment Appearance:  Appears stated age Attitude/Demeanor/Rapport:   (Appropriate) Affect (typically observed):  Accepting, Appropriate Orientation:  Oriented to Situation, Oriented to Place, Oriented to Self Alcohol / Substance use:  Not Applicable Psych involvement (Current and /or in the community):  No (Comment)  Discharge Needs  Concerns to be addressed:  Care Coordination Readmission within the last 30 days:  No Current discharge risk:  None Barriers to Discharge:  Continued Medical Work up   [employer] Incadia S Kynslei Art, LCSWA 02/29/2016, 2:59 PM

## 2016-02-29 NOTE — Progress Notes (Signed)
Postop day 3. Patient doing fairly well. Minimal headache. No other neurologic symptoms. Beginning to mobilize without incident.  Exam stable. Wound clean and dry. Drain output starting to clear.  Follow-up head CT scan still with some left hemispheric edema and some residual subdural hematoma.  Overall doing reasonably well. I think we'll continue drain another 24 hours and then removed tomorrow. Possible that she'll be ready for nursing home placement Thursday.

## 2016-02-29 NOTE — NC FL2 (Signed)
[place] MEDICAID FL2 LEVEL OF CARE SCREENING TOOL     IDENTIFICATION  Patient Name: Teresa Mccullough Birthdate: 01/06/1941 Sex: female Admission Date (Current Location): 02/26/2016  [facility] [place]   Facility and Address:  [facility] [place], [address], [place], [place] [address]      Provider Number: [id number]  Attending Physician Name and Address:  Julio SicksHenry Pool, MD  Relative Name and Phone Number:  Lelon MastSamantha, granddaughter, [phone]    Current Level of Care: Hospital Recommended Level of Care: Skilled Nursing Facility Prior Approval Number:    Date Approved/Denied:   PASRR Number: [id number]  Discharge Plan: SNF    Current Diagnoses: Patient Active Problem List   Diagnosis Date Noted  . SDH (subdural hematoma) (HCC) 02/26/2016  . 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 Generalized anxiety disorder 03/21/2014    Orientation RESPIRATION BLADDER Height & Weight     Self, Place, Situation  O2 (Nasal cannula 1L) Continent Weight: 74.39 kg (164 lb) Height:  5\' 4"  (162.6 cm)  BEHAVIORAL SYMPTOMS/MOOD NEUROLOGICAL BOWEL NUTRITION STATUS      Continent Diet (Please see DC summary)  AMBULATORY STATUS COMMUNICATION OF NEEDS Skin   Limited Assist Verbally Surgical wounds (Closed incision on head)                       Personal Care Assistance Level of Assistance  Bathing, Feeding, Dressing Bathing Assistance: Maximum assistance Feeding assistance: Independent Dressing Assistance: Limited assistance     Functional Limitations Info             SPECIAL CARE FACTORS FREQUENCY  PT (By licensed PT), OT (By licensed OT)     PT Frequency: 5x/week OT Frequency: min 2x/week            Contractures      Additional Factors Info  Code Status, Allergies, Psychotropic, Insulin Sliding Scale Code Status Info: Full Allergies Info: Cyclobenzaprine, Ultram Psychotropic Info: Abillify, Paxil,  Valium Insulin Sliding Scale Info: insulin aspart (novoLOG) injection 0-15 Units       Current Medications (02/29/2016):  This is the current hospital active medication list Current Facility-Administered Medications  Medication Dose Route Frequency Provider Last Rate Last Dose  . 0.9 %  sodium chloride infusion   Intravenous Continuous Julio SicksHenry Pool, MD 50 mL/hr at 02/29/16 1120    . acetaminophen (TYLENOL) tablet 650 mg  650 mg Oral Q4H PRN Julio SicksHenry Pool, MD   650 mg at 02/27/16 1226   Or  . acetaminophen (TYLENOL) suppository 650 mg  650 mg Rectal Q4H PRN Julio SicksHenry Pool, MD      . antiseptic oral rinse (CPC / CETYLPYRIDINIUM CHLORIDE 0.05%) solution 7 mL  7 mL Mouth Rinse BID Julio SicksHenry Pool, MD   7 mL at 02/29/16 1000  . ARIPiprazole (ABILIFY) tablet 10 mg  10 mg Oral Azzie GlatterQODAY Henry Pool, MD   10 mg at 02/29/16 0854  . diazepam (VALIUM) tablet 2 mg  2 mg Oral BID Julio SicksHenry Pool, MD   2 mg at 02/29/16 0855  . HYDROcodone-acetaminophen (NORCO/VICODIN) 5-325 MG per tablet 1 tablet  1 tablet Oral Q4H PRN Julio SicksHenry Pool, MD   1 tablet at 02/27/16 1353  . HYDROmorphone (DILAUDID) injection 0.5-1 mg  0.5-1 mg Intravenous Q2H PRN Julio SicksHenry Pool, MD      . insulin aspart (novoLOG) injection 0-15 Units  0-15 Units Subcutaneous TID WC Julio SicksHenry Pool, MD   2 Units at 02/28/16 1119  .  labetalol (NORMODYNE,TRANDATE) injection 10-40 mg  10-40 mg Intravenous Q10 min PRN Julio SicksHenry Pool, MD      . levothyroxine (SYNTHROID, LEVOTHROID) tablet 75 mcg  75 mcg Oral QAC breakfast Julio SicksHenry Pool, MD   75 mcg at 02/29/16 0854  . metFORMIN (GLUCOPHAGE-XR) 24 hr tablet 500 mg  500 mg Oral Q breakfast Julio SicksHenry Pool, MD   500 mg at 02/29/16 0854  . mirtazapine (REMERON) tablet 30 mg  30 mg Oral QHS Julio SicksHenry Pool, MD   30 mg at 02/28/16 2105  . naloxone [facility](NARCAN) injection 0.08 mg  0.08 mg Intravenous PRN Julio SicksHenry Pool, MD      . ondansetron [facility](ZOFRAN) tablet 4 mg  4 mg Oral Q4H PRN Julio SicksHenry Pool, MD   4 mg at 02/28/16 1822   Or  . ondansetron [facility](ZOFRAN) injection 4 mg  4 mg  Intravenous Q4H PRN Julio SicksHenry Pool, MD   4 mg at 02/26/16 2315  . pantoprazole (PROTONIX) EC tablet 40 mg  40 mg Oral Daily Julio SicksHenry Pool, MD   40 mg at 02/29/16 1000  . PARoxetine (PAXIL) tablet 40 mg  40 mg Oral Daily Julio SicksHenry Pool, MD   40 mg at 02/29/16 0854  . promethazine (PHENERGAN) tablet 12.5-25 mg  12.5-25 mg Oral Q4H PRN Julio SicksHenry Pool, MD      . senna-docusate (Senokot-S) tablet 2 tablet  2 tablet Oral QHS Julio SicksHenry Pool, MD   2 tablet at 02/28/16 2104     Discharge Medications: Please see discharge summary for a list of discharge medications.  Relevant Imaging Results:  Relevant Lab Results:   Additional Information SSN: [id number]  Tavi Gaughran S MontfortRayyan, [place]

## 2016-02-29 NOTE — Care Management Important Message (Signed)
Important Message  Patient Details  Name: Teresa GelinasLucille J Sherburn MRN: [id number] Date of Birth: 06/04/1941   Medicare Important Message Given:  Yes    Kyla BalzarineShealy, Thornton Dohrmann Abena 02/29/2016, 10:43 AM

## 2016-03-01 LAB — GLUCOSE, CAPILLARY
GLUCOSE-CAPILLARY: 122 mg/dL — AB (ref 65–99)
GLUCOSE-CAPILLARY: 102 mg/dL — AB (ref 65–99)
GLUCOSE-CAPILLARY: 129 mg/dL — AB (ref 65–99)
Glucose-Capillary: 116 mg/dL — ABNORMAL HIGH (ref 65–99)

## 2016-03-01 NOTE — Progress Notes (Signed)
Overall stable. Patient notes a little bit a headache. Afebrile. Vitals are stable. Drain output predominantly blood-tinged CSF at this point. Wounds clean and dry. Neurologically she is awake and alert. She is oriented and appropriate. Her speech is fluent. Motor and sensory are intact.  I removed the drain today. We'll transfer to floor. Continue efforts at mobilization. Nearing discharged back to skilled nursing facility.

## 2016-03-02 ENCOUNTER — Ambulatory Visit (HOSPITAL_COMMUNITY): Payer: Self-pay | Admitting: Psychiatry

## 2016-03-02 DIAGNOSIS — I6200 Nontraumatic subdural hemorrhage, unspecified: Secondary | ICD-10-CM | POA: Diagnosis not present

## 2016-03-02 LAB — GLUCOSE, CAPILLARY
GLUCOSE-CAPILLARY: 133 mg/dL — AB (ref 65–99)
GLUCOSE-CAPILLARY: 118 mg/dL — AB (ref 65–99)
Glucose-Capillary: 109 mg/dL — ABNORMAL HIGH (ref 65–99)

## 2016-03-02 NOTE — Clinical Social Work Note (Signed)
Patient transferred from 3S to 5C. Handoff information given to 5C CSW.  This CSW signing off.  Schylar Allard, CSW [phone]  

## 2016-03-02 NOTE — Progress Notes (Signed)
Patient will DC to: [facility] Anticipated DC date: 03/02/16 Family notified: Lelon MastSamantha Transport by: Sharin MonsPTAR   Per MD patient ready for [facility]. RN, patient, patient's family, and facility notified of DC. MD stated he removed Hydrocodone from patient's discharge medications. CSW [facility]-sent [facility]. RN given number for report. DC packet on chart. Ambulance transport requested for patient.   CSW signing off.  Cristobal GoldmannNadia Alroy Portela, [place]WA Clinical Social Worker [phone]

## 2016-03-02 NOTE — Progress Notes (Signed)
Pt for discharge today. Discharge orders received. IV and telemetry dcd with dressing clean dry and intact.  PTAR arrived at 1710 to transfer pt to [facility]. Report called to facility.

## 2016-03-02 NOTE — Discharge Summary (Addendum)
Physician Discharge Summary  Patient ID: Violeta GelinasLucille J Dufner MRN: [id number] DOB/AGE: 77/01/1941 77 y.o.  Admit date: 02/26/2016 Discharge date: 03/02/2016  Admission Diagnoses:  Discharge Diagnoses:  Active Problems:   SDH (subdural hematoma) (HCC)   Post-traumatic subdural hematoma [facility](HCC)   Discharged Condition: good  Hospital Course: Patient admitted to the hospital with an enlarging left convexity subacute subdural hematoma with marked mass effect. Patient underwent uncomplicated burr hole evacuation. Postoperatively she's been progressing well. Headache and right-sided symptoms much improved. Dissipating and therapy. Ready for discharge back to skilled nursing facility.  Consults:   Significant Diagnostic Studies:   Treatments:   Discharge Exam: Blood pressure 132/61, pulse 56, temperature 97.6 F (36.4 C), temperature source Oral, resp. rate 20, height 5\' 4"  (1.626 m), weight 75.6 kg (166 lb 10.7 oz), SpO2 99 %. Awake and alert. Oriented and appropriate. Cranial nerve function intact. Motor 5/5 bilaterally. Speech fluent. Judgment and insight intact. Wound clean and dry. Chest and abdomen benign.  Disposition: 01-Home or Self Care        Follow-up Information    Follow up with Xue Low A, MD. Schedule an appointment as soon as possible for a visit in 2 weeks.   Specialty:  Neurosurgery   Contact information:   [address] [place] [address] [phone]       Signed: Temple PaciniOOL,Synthia Fairbank A 03/02/2016, 10:08 AM

## 2016-03-02 NOTE — Progress Notes (Signed)
Stable. Ready for dc to snif

## 2016-03-02 NOTE — Care Management Important Message (Signed)
Important Message  Patient Details  Name: Violeta GelinasLucille J Delamater MRN: [id number] Date of Birth: 05/19/1941   Medicare Important Message Given:  Yes    Bernadette HoitShoffner, Jazae Gandolfi Coleman 03/02/2016, 10:10 AM

## 2016-03-05 DIAGNOSIS — M545 Low back pain: Secondary | ICD-10-CM | POA: Diagnosis not present

## 2016-03-05 DIAGNOSIS — E039 Hypothyroidism, unspecified: Secondary | ICD-10-CM | POA: Diagnosis not present

## 2016-03-05 DIAGNOSIS — R0902 Hypoxemia: Secondary | ICD-10-CM | POA: Diagnosis not present

## 2016-03-05 DIAGNOSIS — K5900 Constipation, unspecified: Secondary | ICD-10-CM | POA: Diagnosis not present

## 2016-03-05 DIAGNOSIS — K219 Gastro-esophageal reflux disease without esophagitis: Secondary | ICD-10-CM | POA: Diagnosis not present

## 2016-03-05 DIAGNOSIS — X58XXXD Exposure to other specified factors, subsequent encounter: Secondary | ICD-10-CM | POA: Diagnosis not present

## 2016-03-05 DIAGNOSIS — E119 Type 2 diabetes mellitus without complications: Secondary | ICD-10-CM | POA: Diagnosis not present

## 2016-03-05 DIAGNOSIS — M6281 Muscle weakness (generalized): Secondary | ICD-10-CM | POA: Diagnosis not present

## 2016-03-05 DIAGNOSIS — S065X0D Traumatic subdural hemorrhage without loss of consciousness, subsequent encounter: Secondary | ICD-10-CM | POA: Diagnosis not present

## 2016-03-16 DIAGNOSIS — M4806 Spinal stenosis, lumbar region: Secondary | ICD-10-CM | POA: Diagnosis not present

## 2016-03-16 DIAGNOSIS — R531 Weakness: Secondary | ICD-10-CM | POA: Diagnosis not present

## 2016-03-16 DIAGNOSIS — Z9181 History of falling: Secondary | ICD-10-CM | POA: Diagnosis not present

## 2016-03-16 DIAGNOSIS — Z7984 Long term (current) use of oral hypoglycemic drugs: Secondary | ICD-10-CM | POA: Diagnosis not present

## 2016-03-16 DIAGNOSIS — E119 Type 2 diabetes mellitus without complications: Secondary | ICD-10-CM | POA: Diagnosis not present

## 2016-03-16 DIAGNOSIS — E039 Hypothyroidism, unspecified: Secondary | ICD-10-CM | POA: Diagnosis not present

## 2016-03-16 DIAGNOSIS — K219 Gastro-esophageal reflux disease without esophagitis: Secondary | ICD-10-CM | POA: Diagnosis not present

## 2016-03-16 DIAGNOSIS — Z4889 Encounter for other specified surgical aftercare: Secondary | ICD-10-CM | POA: Diagnosis not present

## 2016-03-18 ENCOUNTER — Other Ambulatory Visit: Payer: Self-pay | Admitting: Neurosurgery

## 2016-03-18 DIAGNOSIS — R531 Weakness: Secondary | ICD-10-CM | POA: Diagnosis not present

## 2016-03-18 DIAGNOSIS — E039 Hypothyroidism, unspecified: Secondary | ICD-10-CM | POA: Diagnosis not present

## 2016-03-18 DIAGNOSIS — Z9181 History of falling: Secondary | ICD-10-CM | POA: Diagnosis not present

## 2016-03-18 DIAGNOSIS — I6200 Nontraumatic subdural hemorrhage, unspecified: Secondary | ICD-10-CM

## 2016-03-18 DIAGNOSIS — Z7984 Long term (current) use of oral hypoglycemic drugs: Secondary | ICD-10-CM | POA: Diagnosis not present

## 2016-03-18 DIAGNOSIS — Z4889 Encounter for other specified surgical aftercare: Secondary | ICD-10-CM | POA: Diagnosis not present

## 2016-03-18 DIAGNOSIS — E119 Type 2 diabetes mellitus without complications: Secondary | ICD-10-CM | POA: Diagnosis not present

## 2016-03-18 DIAGNOSIS — M4806 Spinal stenosis, lumbar region: Secondary | ICD-10-CM | POA: Diagnosis not present

## 2016-03-18 DIAGNOSIS — K219 Gastro-esophageal reflux disease without esophagitis: Secondary | ICD-10-CM | POA: Diagnosis not present

## 2016-03-21 DIAGNOSIS — M4806 Spinal stenosis, lumbar region: Secondary | ICD-10-CM | POA: Diagnosis not present

## 2016-03-21 DIAGNOSIS — E039 Hypothyroidism, unspecified: Secondary | ICD-10-CM | POA: Diagnosis not present

## 2016-03-21 DIAGNOSIS — Z9181 History of falling: Secondary | ICD-10-CM | POA: Diagnosis not present

## 2016-03-21 DIAGNOSIS — Z7984 Long term (current) use of oral hypoglycemic drugs: Secondary | ICD-10-CM | POA: Diagnosis not present

## 2016-03-21 DIAGNOSIS — Z4889 Encounter for other specified surgical aftercare: Secondary | ICD-10-CM | POA: Diagnosis not present

## 2016-03-21 DIAGNOSIS — R531 Weakness: Secondary | ICD-10-CM | POA: Diagnosis not present

## 2016-03-21 DIAGNOSIS — E119 Type 2 diabetes mellitus without complications: Secondary | ICD-10-CM | POA: Diagnosis not present

## 2016-03-21 DIAGNOSIS — K219 Gastro-esophageal reflux disease without esophagitis: Secondary | ICD-10-CM | POA: Diagnosis not present

## 2016-03-22 DIAGNOSIS — M6281 Muscle weakness (generalized): Secondary | ICD-10-CM | POA: Diagnosis not present

## 2016-03-22 DIAGNOSIS — R279 Unspecified lack of coordination: Secondary | ICD-10-CM | POA: Diagnosis not present

## 2016-03-22 DIAGNOSIS — M4806 Spinal stenosis, lumbar region: Secondary | ICD-10-CM | POA: Diagnosis not present

## 2016-03-22 DIAGNOSIS — R269 Unspecified abnormalities of gait and mobility: Secondary | ICD-10-CM | POA: Diagnosis not present

## 2016-03-24 DIAGNOSIS — Z4889 Encounter for other specified surgical aftercare: Secondary | ICD-10-CM | POA: Diagnosis not present

## 2016-03-24 DIAGNOSIS — E039 Hypothyroidism, unspecified: Secondary | ICD-10-CM | POA: Diagnosis not present

## 2016-03-24 DIAGNOSIS — K219 Gastro-esophageal reflux disease without esophagitis: Secondary | ICD-10-CM | POA: Diagnosis not present

## 2016-03-24 DIAGNOSIS — Z7984 Long term (current) use of oral hypoglycemic drugs: Secondary | ICD-10-CM | POA: Diagnosis not present

## 2016-03-24 DIAGNOSIS — M4806 Spinal stenosis, lumbar region: Secondary | ICD-10-CM | POA: Diagnosis not present

## 2016-03-24 DIAGNOSIS — Z9181 History of falling: Secondary | ICD-10-CM | POA: Diagnosis not present

## 2016-03-24 DIAGNOSIS — E119 Type 2 diabetes mellitus without complications: Secondary | ICD-10-CM | POA: Diagnosis not present

## 2016-03-24 DIAGNOSIS — R531 Weakness: Secondary | ICD-10-CM | POA: Diagnosis not present

## 2016-03-28 DIAGNOSIS — K219 Gastro-esophageal reflux disease without esophagitis: Secondary | ICD-10-CM | POA: Diagnosis not present

## 2016-03-28 DIAGNOSIS — Z79899 Other long term (current) drug therapy: Secondary | ICD-10-CM | POA: Diagnosis not present

## 2016-03-28 DIAGNOSIS — R531 Weakness: Secondary | ICD-10-CM | POA: Diagnosis not present

## 2016-03-28 DIAGNOSIS — Z7984 Long term (current) use of oral hypoglycemic drugs: Secondary | ICD-10-CM | POA: Diagnosis not present

## 2016-03-28 DIAGNOSIS — K5641 Fecal impaction: Secondary | ICD-10-CM | POA: Diagnosis not present

## 2016-03-28 DIAGNOSIS — Z9181 History of falling: Secondary | ICD-10-CM | POA: Diagnosis not present

## 2016-03-28 DIAGNOSIS — R197 Diarrhea, unspecified: Secondary | ICD-10-CM | POA: Diagnosis not present

## 2016-03-28 DIAGNOSIS — M4806 Spinal stenosis, lumbar region: Secondary | ICD-10-CM | POA: Diagnosis not present

## 2016-03-28 DIAGNOSIS — E039 Hypothyroidism, unspecified: Secondary | ICD-10-CM | POA: Diagnosis not present

## 2016-03-28 DIAGNOSIS — R109 Unspecified abdominal pain: Secondary | ICD-10-CM | POA: Diagnosis not present

## 2016-03-28 DIAGNOSIS — E119 Type 2 diabetes mellitus without complications: Secondary | ICD-10-CM | POA: Diagnosis not present

## 2016-03-28 DIAGNOSIS — Z4889 Encounter for other specified surgical aftercare: Secondary | ICD-10-CM | POA: Diagnosis not present

## 2016-03-29 DIAGNOSIS — Z4889 Encounter for other specified surgical aftercare: Secondary | ICD-10-CM | POA: Diagnosis not present

## 2016-03-29 DIAGNOSIS — E119 Type 2 diabetes mellitus without complications: Secondary | ICD-10-CM | POA: Diagnosis not present

## 2016-03-29 DIAGNOSIS — E039 Hypothyroidism, unspecified: Secondary | ICD-10-CM | POA: Diagnosis not present

## 2016-03-29 DIAGNOSIS — M4806 Spinal stenosis, lumbar region: Secondary | ICD-10-CM | POA: Diagnosis not present

## 2016-03-29 DIAGNOSIS — Z9181 History of falling: Secondary | ICD-10-CM | POA: Diagnosis not present

## 2016-03-29 DIAGNOSIS — R531 Weakness: Secondary | ICD-10-CM | POA: Diagnosis not present

## 2016-03-29 DIAGNOSIS — K219 Gastro-esophageal reflux disease without esophagitis: Secondary | ICD-10-CM | POA: Diagnosis not present

## 2016-03-29 DIAGNOSIS — Z7984 Long term (current) use of oral hypoglycemic drugs: Secondary | ICD-10-CM | POA: Diagnosis not present

## 2016-03-30 ENCOUNTER — Ambulatory Visit
Admission: RE | Admit: 2016-03-30 | Discharge: 2016-03-30 | Disposition: A | Discharge status: Home / Self Care | Payer: Medicare Other | Source: Ambulatory Visit | Attending: Neurosurgery | Admitting: Neurosurgery

## 2016-03-30 DIAGNOSIS — M4806 Spinal stenosis, lumbar region: Secondary | ICD-10-CM | POA: Diagnosis not present

## 2016-03-30 DIAGNOSIS — Z7984 Long term (current) use of oral hypoglycemic drugs: Secondary | ICD-10-CM | POA: Diagnosis not present

## 2016-03-30 DIAGNOSIS — S065X0A Traumatic subdural hemorrhage without loss of consciousness, initial encounter: Secondary | ICD-10-CM | POA: Diagnosis not present

## 2016-03-30 DIAGNOSIS — K219 Gastro-esophageal reflux disease without esophagitis: Secondary | ICD-10-CM | POA: Diagnosis not present

## 2016-03-30 DIAGNOSIS — Z9181 History of falling: Secondary | ICD-10-CM | POA: Diagnosis not present

## 2016-03-30 DIAGNOSIS — E039 Hypothyroidism, unspecified: Secondary | ICD-10-CM | POA: Diagnosis not present

## 2016-03-30 DIAGNOSIS — R531 Weakness: Secondary | ICD-10-CM | POA: Diagnosis not present

## 2016-03-30 DIAGNOSIS — E119 Type 2 diabetes mellitus without complications: Secondary | ICD-10-CM | POA: Diagnosis not present

## 2016-03-30 DIAGNOSIS — Z4889 Encounter for other specified surgical aftercare: Secondary | ICD-10-CM | POA: Diagnosis not present

## 2016-03-30 DIAGNOSIS — I6200 Nontraumatic subdural hemorrhage, unspecified: Secondary | ICD-10-CM

## 2016-03-31 DIAGNOSIS — M4806 Spinal stenosis, lumbar region: Secondary | ICD-10-CM | POA: Diagnosis not present

## 2016-03-31 DIAGNOSIS — Z7984 Long term (current) use of oral hypoglycemic drugs: Secondary | ICD-10-CM | POA: Diagnosis not present

## 2016-03-31 DIAGNOSIS — R531 Weakness: Secondary | ICD-10-CM | POA: Diagnosis not present

## 2016-03-31 DIAGNOSIS — E039 Hypothyroidism, unspecified: Secondary | ICD-10-CM | POA: Diagnosis not present

## 2016-03-31 DIAGNOSIS — K219 Gastro-esophageal reflux disease without esophagitis: Secondary | ICD-10-CM | POA: Diagnosis not present

## 2016-03-31 DIAGNOSIS — Z9181 History of falling: Secondary | ICD-10-CM | POA: Diagnosis not present

## 2016-03-31 DIAGNOSIS — E119 Type 2 diabetes mellitus without complications: Secondary | ICD-10-CM | POA: Diagnosis not present

## 2016-03-31 DIAGNOSIS — Z4889 Encounter for other specified surgical aftercare: Secondary | ICD-10-CM | POA: Diagnosis not present

## 2016-04-01 DIAGNOSIS — M4806 Spinal stenosis, lumbar region: Secondary | ICD-10-CM | POA: Diagnosis not present

## 2016-04-01 DIAGNOSIS — K219 Gastro-esophageal reflux disease without esophagitis: Secondary | ICD-10-CM | POA: Diagnosis not present

## 2016-04-01 DIAGNOSIS — Z4889 Encounter for other specified surgical aftercare: Secondary | ICD-10-CM | POA: Diagnosis not present

## 2016-04-01 DIAGNOSIS — Z7984 Long term (current) use of oral hypoglycemic drugs: Secondary | ICD-10-CM | POA: Diagnosis not present

## 2016-04-01 DIAGNOSIS — R531 Weakness: Secondary | ICD-10-CM | POA: Diagnosis not present

## 2016-04-01 DIAGNOSIS — Z9181 History of falling: Secondary | ICD-10-CM | POA: Diagnosis not present

## 2016-04-01 DIAGNOSIS — E039 Hypothyroidism, unspecified: Secondary | ICD-10-CM | POA: Diagnosis not present

## 2016-04-01 DIAGNOSIS — E119 Type 2 diabetes mellitus without complications: Secondary | ICD-10-CM | POA: Diagnosis not present

## 2016-04-04 DIAGNOSIS — Z9181 History of falling: Secondary | ICD-10-CM | POA: Diagnosis not present

## 2016-04-04 DIAGNOSIS — K219 Gastro-esophageal reflux disease without esophagitis: Secondary | ICD-10-CM | POA: Diagnosis not present

## 2016-04-04 DIAGNOSIS — M4806 Spinal stenosis, lumbar region: Secondary | ICD-10-CM | POA: Diagnosis not present

## 2016-04-04 DIAGNOSIS — E119 Type 2 diabetes mellitus without complications: Secondary | ICD-10-CM | POA: Diagnosis not present

## 2016-04-04 DIAGNOSIS — Z7984 Long term (current) use of oral hypoglycemic drugs: Secondary | ICD-10-CM | POA: Diagnosis not present

## 2016-04-04 DIAGNOSIS — E039 Hypothyroidism, unspecified: Secondary | ICD-10-CM | POA: Diagnosis not present

## 2016-04-04 DIAGNOSIS — Z4889 Encounter for other specified surgical aftercare: Secondary | ICD-10-CM | POA: Diagnosis not present

## 2016-04-04 DIAGNOSIS — R531 Weakness: Secondary | ICD-10-CM | POA: Diagnosis not present

## 2016-04-05 ENCOUNTER — Telehealth (HOSPITAL_COMMUNITY): Payer: Self-pay | Admitting: *Deleted

## 2016-04-05 NOTE — Telephone Encounter (Signed)
Pt called stating she's just calling to let Dr. Tenny Craw know that she is out of the hospital and that Dr. Dimas Aguas took her off her Valium that Dr. Tenny Craw put her on. Pt stated she will have her granddaughter call office back to sch appt when she gets the permission to leave the house again.

## 2016-04-06 NOTE — Telephone Encounter (Signed)
ok 

## 2016-04-07 DIAGNOSIS — K219 Gastro-esophageal reflux disease without esophagitis: Secondary | ICD-10-CM | POA: Diagnosis not present

## 2016-04-07 DIAGNOSIS — M4806 Spinal stenosis, lumbar region: Secondary | ICD-10-CM | POA: Diagnosis not present

## 2016-04-07 DIAGNOSIS — Z4889 Encounter for other specified surgical aftercare: Secondary | ICD-10-CM | POA: Diagnosis not present

## 2016-04-07 DIAGNOSIS — Z7984 Long term (current) use of oral hypoglycemic drugs: Secondary | ICD-10-CM | POA: Diagnosis not present

## 2016-04-07 DIAGNOSIS — E039 Hypothyroidism, unspecified: Secondary | ICD-10-CM | POA: Diagnosis not present

## 2016-04-07 DIAGNOSIS — Z9181 History of falling: Secondary | ICD-10-CM | POA: Diagnosis not present

## 2016-04-07 DIAGNOSIS — R531 Weakness: Secondary | ICD-10-CM | POA: Diagnosis not present

## 2016-04-07 DIAGNOSIS — E119 Type 2 diabetes mellitus without complications: Secondary | ICD-10-CM | POA: Diagnosis not present

## 2016-04-14 DIAGNOSIS — E039 Hypothyroidism, unspecified: Secondary | ICD-10-CM | POA: Diagnosis not present

## 2016-05-05 DIAGNOSIS — E1165 Type 2 diabetes mellitus with hyperglycemia: Secondary | ICD-10-CM | POA: Diagnosis not present

## 2016-05-05 DIAGNOSIS — K219 Gastro-esophageal reflux disease without esophagitis: Secondary | ICD-10-CM | POA: Diagnosis not present

## 2016-05-05 DIAGNOSIS — K5901 Slow transit constipation: Secondary | ICD-10-CM | POA: Diagnosis not present

## 2016-05-05 DIAGNOSIS — E039 Hypothyroidism, unspecified: Secondary | ICD-10-CM | POA: Diagnosis not present

## 2016-05-05 DIAGNOSIS — I10 Essential (primary) hypertension: Secondary | ICD-10-CM | POA: Diagnosis not present

## 2016-05-05 DIAGNOSIS — E7800 Pure hypercholesterolemia, unspecified: Secondary | ICD-10-CM | POA: Diagnosis not present

## 2016-05-10 DIAGNOSIS — I6200 Nontraumatic subdural hemorrhage, unspecified: Secondary | ICD-10-CM | POA: Diagnosis not present

## 2016-05-15 ENCOUNTER — Other Ambulatory Visit (HOSPITAL_COMMUNITY): Payer: Self-pay | Admitting: Psychiatry

## 2016-05-16 ENCOUNTER — Telehealth (HOSPITAL_COMMUNITY): Payer: Self-pay | Admitting: *Deleted

## 2016-05-16 NOTE — Telephone Encounter (Signed)
Pt pharmacy requesting refills for pt Paxil 40 mg QD. Pt medication last filled on 12-02-15 with 30 tabs 2 refills. Pt have not seen provider since 12-02-15 due to recovering from previous hospital stay and have not been cleared by doctor to leave home. Pt pharmacy number is [phone]

## 2016-05-17 ENCOUNTER — Other Ambulatory Visit (HOSPITAL_COMMUNITY): Payer: Self-pay | Admitting: Psychiatry

## 2016-05-17 MED ORDER — PAROXETINE HCL 40 MG PO TABS: 40.0000 mg | ORAL_TABLET | Freq: Every day | ORAL | 2 refills | Status: DC

## 2016-05-17 NOTE — Telephone Encounter (Signed)
Noted. lmtcb on Lenn SinkSamantha Johnson (granddaughter) voicemail to call office to sch appt per Dr. Tenny Crawoss

## 2016-05-17 NOTE — Telephone Encounter (Signed)
Sent, she needs appt 

## 2016-05-17 NOTE — Telephone Encounter (Signed)
Spoke with pt granddaughter and she agreed to sch f/u appt for pt.

## 2016-06-13 ENCOUNTER — Ambulatory Visit (HOSPITAL_COMMUNITY): Payer: Self-pay | Admitting: Psychiatry

## 2016-06-13 ENCOUNTER — Encounter (HOSPITAL_COMMUNITY): Payer: Self-pay

## 2016-07-10 DIAGNOSIS — R0902 Hypoxemia: Secondary | ICD-10-CM | POA: Diagnosis not present

## 2016-07-19 DIAGNOSIS — E039 Hypothyroidism, unspecified: Secondary | ICD-10-CM | POA: Diagnosis not present

## 2016-07-19 DIAGNOSIS — M797 Fibromyalgia: Secondary | ICD-10-CM | POA: Diagnosis not present

## 2016-07-19 DIAGNOSIS — M545 Low back pain: Secondary | ICD-10-CM | POA: Diagnosis not present

## 2016-08-03 ENCOUNTER — Other Ambulatory Visit (HOSPITAL_COMMUNITY): Payer: Self-pay | Admitting: Psychiatry

## 2016-08-09 DIAGNOSIS — R0902 Hypoxemia: Secondary | ICD-10-CM | POA: Diagnosis not present

## 2016-08-12 ENCOUNTER — Telehealth (HOSPITAL_COMMUNITY): Payer: Self-pay | Admitting: *Deleted

## 2016-08-12 NOTE — Telephone Encounter (Signed)
Pt pharmacy requesting Mirtazapine 30 mg QHS. Pt medication was last filled on 12-02-2015 with 30 tabs 2 refills. Pt had an appt on 06-13-2016 but she no showed. Below is a not that was noted on day of appt. PT CALLED AT 10:58 AM ON 06-13-2016 STATING SHE WILL NOT BE COMING TODAY BECAUSE SHE DON'T FEEL LIKE IT TODAY. PER PT HER SLEEPING MEDS ARE NOT WORKING AND SHE DON'T WANT TO COME IN TODAY TO TALK ABOUT HER SLEEPING MEDS AND WILL CALL BACK TO RESCH APPT. Pt pharmacy [place] [facility] [place].

## 2016-08-15 NOTE — Telephone Encounter (Signed)
FYI: Called pt granddaughter Teresa Mccullough and informed her with what provider stated. Per Teresa Mccullough, she will talk with pt and see what she wants to do. Per Teresa Mccullough, she thinks pt stopped her medications but don't know. Per Teresa Mccullough, she thinks pt is still taking the Abilify but is not 100% sure but she will talk with pt as to what she wants to do.

## 2016-08-15 NOTE — Telephone Encounter (Signed)
Please call her or granddaughter and tell them I cannot refill meds unless she schedules an appt

## 2016-08-17 ENCOUNTER — Other Ambulatory Visit (HOSPITAL_COMMUNITY): Payer: Self-pay | Admitting: Psychiatry

## 2016-08-18 NOTE — Telephone Encounter (Signed)
Contacted pt granddaughter about having pt f/u with Dr. Tenny Crawoss and pt granddaughter stated she will contact pt and see what she wants to do and call office back. Office have not received a call back from either pt or granddaughter.

## 2016-09-09 DIAGNOSIS — R0902 Hypoxemia: Secondary | ICD-10-CM | POA: Diagnosis not present

## 2016-10-10 DIAGNOSIS — R0902 Hypoxemia: Secondary | ICD-10-CM | POA: Diagnosis not present

## 2016-11-07 DIAGNOSIS — R0902 Hypoxemia: Secondary | ICD-10-CM | POA: Diagnosis not present

## 2016-12-01 DIAGNOSIS — E1165 Type 2 diabetes mellitus with hyperglycemia: Secondary | ICD-10-CM | POA: Diagnosis not present

## 2016-12-01 DIAGNOSIS — E039 Hypothyroidism, unspecified: Secondary | ICD-10-CM | POA: Diagnosis not present

## 2016-12-01 DIAGNOSIS — Z0001 Encounter for general adult medical examination with abnormal findings: Secondary | ICD-10-CM | POA: Diagnosis not present

## 2016-12-01 DIAGNOSIS — E7800 Pure hypercholesterolemia, unspecified: Secondary | ICD-10-CM | POA: Diagnosis not present

## 2016-12-08 DIAGNOSIS — R0902 Hypoxemia: Secondary | ICD-10-CM | POA: Diagnosis not present

## 2017-01-07 DIAGNOSIS — R0902 Hypoxemia: Secondary | ICD-10-CM | POA: Diagnosis not present

## 2017-01-26 DIAGNOSIS — H353222 Exudative age-related macular degeneration, left eye, with inactive choroidal neovascularization: Secondary | ICD-10-CM | POA: Diagnosis not present

## 2017-01-26 DIAGNOSIS — Z7984 Long term (current) use of oral hypoglycemic drugs: Secondary | ICD-10-CM | POA: Diagnosis not present

## 2017-01-26 DIAGNOSIS — E119 Type 2 diabetes mellitus without complications: Secondary | ICD-10-CM | POA: Diagnosis not present

## 2017-01-26 DIAGNOSIS — Z961 Presence of intraocular lens: Secondary | ICD-10-CM | POA: Diagnosis not present

## 2017-02-07 DIAGNOSIS — R0902 Hypoxemia: Secondary | ICD-10-CM | POA: Diagnosis not present

## 2017-03-09 DIAGNOSIS — R0902 Hypoxemia: Secondary | ICD-10-CM | POA: Diagnosis not present

## 2017-04-09 DIAGNOSIS — R0902 Hypoxemia: Secondary | ICD-10-CM | POA: Diagnosis not present

## 2017-05-10 DIAGNOSIS — R0902 Hypoxemia: Secondary | ICD-10-CM | POA: Diagnosis not present

## 2017-06-09 DIAGNOSIS — R0902 Hypoxemia: Secondary | ICD-10-CM | POA: Diagnosis not present

## 2017-06-19 DIAGNOSIS — I10 Essential (primary) hypertension: Secondary | ICD-10-CM | POA: Diagnosis not present

## 2017-06-19 DIAGNOSIS — E039 Hypothyroidism, unspecified: Secondary | ICD-10-CM | POA: Diagnosis not present

## 2017-06-19 DIAGNOSIS — M545 Low back pain: Secondary | ICD-10-CM | POA: Diagnosis not present

## 2017-06-19 DIAGNOSIS — E1165 Type 2 diabetes mellitus with hyperglycemia: Secondary | ICD-10-CM | POA: Diagnosis not present

## 2017-06-19 DIAGNOSIS — Z23 Encounter for immunization: Secondary | ICD-10-CM | POA: Diagnosis not present

## 2017-07-10 DIAGNOSIS — R0902 Hypoxemia: Secondary | ICD-10-CM | POA: Diagnosis not present

## 2017-07-30 DIAGNOSIS — E119 Type 2 diabetes mellitus without complications: Secondary | ICD-10-CM | POA: Diagnosis not present

## 2017-07-30 DIAGNOSIS — K219 Gastro-esophageal reflux disease without esophagitis: Secondary | ICD-10-CM | POA: Diagnosis not present

## 2017-07-30 DIAGNOSIS — S8262XA Displaced fracture of lateral malleolus of left fibula, initial encounter for closed fracture: Secondary | ICD-10-CM | POA: Diagnosis not present

## 2017-07-30 DIAGNOSIS — E039 Hypothyroidism, unspecified: Secondary | ICD-10-CM | POA: Diagnosis not present

## 2017-07-30 DIAGNOSIS — S82832A Other fracture of upper and lower end of left fibula, initial encounter for closed fracture: Secondary | ICD-10-CM | POA: Diagnosis not present

## 2017-07-30 DIAGNOSIS — Z7984 Long term (current) use of oral hypoglycemic drugs: Secondary | ICD-10-CM | POA: Diagnosis not present

## 2017-07-30 DIAGNOSIS — X501XXA Overexertion from prolonged static or awkward postures, initial encounter: Secondary | ICD-10-CM | POA: Diagnosis not present

## 2017-07-30 DIAGNOSIS — S82402A Unspecified fracture of shaft of left fibula, initial encounter for closed fracture: Secondary | ICD-10-CM | POA: Diagnosis not present

## 2017-08-02 ENCOUNTER — Ambulatory Visit (INDEPENDENT_AMBULATORY_CARE_PROVIDER_SITE_OTHER): Payer: Medicare Other | Admitting: Orthopaedic Surgery

## 2017-08-02 ENCOUNTER — Encounter (INDEPENDENT_AMBULATORY_CARE_PROVIDER_SITE_OTHER): Payer: Self-pay | Admitting: Orthopaedic Surgery

## 2017-08-02 VITALS — BP 117/65 | HR 72 | Resp 16 | Ht 64.0 in | Wt 155.0 lb

## 2017-08-02 DIAGNOSIS — S82892A Other fracture of left lower leg, initial encounter for closed fracture: Secondary | ICD-10-CM | POA: Diagnosis not present

## 2017-08-02 NOTE — Progress Notes (Deleted)
Office Visit Note   Patient: Teresa Mccullough           Date of Birth: 12/06/1940           MRN: [id number] Visit Date: 08/02/2017              Requested by: Teresa FlavinHoward, Kevin, MD [address] [place], [place] [address] PCP: Teresa FlavinHoward, Kevin, MD   Assessment & Plan: Visit Diagnoses: No diagnosis found.  Plan: ***  Follow-Up Instructions: No Follow-up on file.   Orders:  No orders of the defined types were placed in this encounter.  No orders of the defined types were placed in this encounter.     Procedures: No procedures performed   Clinical Data: No additional findings.   Subjective: Chief Complaint  Patient presents with  . Left Ankle - Fracture, Pain, Edema    Ms. Teresa Mccullough is a 76 y o that lives  alone and HX of multiple falls. She twisted her left ankle while getting out if shower. [facility].    HPI  Review of Systems  Constitutional: Negative for chills, fatigue and fever.  Eyes: Negative for itching.  Respiratory: Negative for chest tightness and shortness of breath.   Cardiovascular: Positive for leg swelling. Negative for chest pain and palpitations.  Gastrointestinal: Negative for blood in stool, constipation and diarrhea.  Endocrine: Negative for polyuria.  Genitourinary: Negative for dysuria.  Musculoskeletal: Positive for joint swelling. Negative for back pain, neck pain and neck stiffness.  Allergic/Immunologic: Negative for immunocompromised state.  Neurological: Positive for weakness and numbness. Negative for dizziness.  Hematological: Does not bruise/bleed easily.  Psychiatric/Behavioral: The patient is nervous/anxious.      Objective: Vital Signs: BP 117/65   Pulse 72   Resp 16   Ht 5\' 4"  (1.626 m)   Wt 155 lb (70.3 kg)   BMI 26.61 kg/m   Physical Exam  Ortho Exam  Specialty Comments:  No specialty comments available.  Imaging: No results found.   PMFS History: Patient Active Problem List   Diagnosis Date Noted  . SDH  (subdural hematoma) (HCC) 02/26/2016  . 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 Generalized anxiety disorder 03/21/2014   Past Medical History:  Diagnosis Date  . Anxiety   . Back pain   . Constipation   . Depression   . Diabetes mellitus, type II (HCC)   . GERD (gastroesophageal reflux disease)   . Thyroid disease     Family History  Problem Relation Age of Onset  . Depression Sister   . Alcohol abuse Brother   . Alcohol abuse Son   . Alcohol abuse Father     Past Surgical History:  Procedure Laterality Date  . ABDOMINAL HYSTERECTOMY    . BURR HOLE Left 02/26/2016   Procedure: Guss BundeBURR HOLE;  Surgeon: Julio SicksHenry Pool, MD;  [facility];  Service: Neurosurgery;  Laterality: Left;  . CATARACT EXTRACTION W/PHACO Right 10/01/2015   Procedure: CATARACT EXTRACTION PHACO AND INTRAOCULAR LENS PLACEMENT (IOC);  Surgeon: Gemma PayorKerry Hunt, MD;  Location: [facility];  Service: Ophthalmology;  Laterality: Right;  CDE:6.22  . goiter removed    . ulcer surgery     Social History   Occupational History  . Not on file  Tobacco Use  . Smoking status: Never Smoker  . Smokeless tobacco: Never Used  Substance and Sexual Activity  . Alcohol use: No  . Drug use: No  . Sexual activity: No

## 2017-08-02 NOTE — Progress Notes (Signed)
Office Visit Note   Patient: Teresa Mccullough           Date of Birth: 03/13/1941           MRN: [id number] Visit Date: 08/02/2017              Requested by: Selinda FlavinHoward, Kevin, MD [address] [place], [place] [address] PCP: Selinda FlavinHoward, Kevin, MD   Assessment & Plan: Visit Diagnoses:  1. Closed fracture of left ankle, initial encounter     Plan: Minimal displacement of left distal fibula fracture. We'll treat with limited weightbearing and short leg cast. Return in 2 weeks for repeat films  Follow-Up Instructions: Return in about 2 weeks (around 08/16/2017).   Orders:  No orders of the defined types were placed in this encounter.  No orders of the defined types were placed in this encounter.     Procedures: No procedures performed   Clinical Data: No additional findings.   Subjective: Chief Complaint  Patient presents with  . Left Ankle - Fracture, Pain, Edema    Teresa Mccullough is a 76 y o that lives  alone and HX of multiple falls. She twisted her left ankle while getting out if shower. [facility].  Films were reviewed on the PACS system. There is an oblique fracture of the distal fibula. Ankle mortise is intact. Presently comfortable with minimal discomfort in a short leg splint  HPI  Review of Systems   Objective: Vital Signs: BP 117/65   Pulse 72   Resp 16   Ht 5\' 4"  (1.626 m)   Wt 155 lb (70.3 kg)   BMI 26.61 kg/m   Physical Exam  Ortho Exam splint was removed. There is diffuse ecchymosis about the ankle and the dorsum of her foot. Skin otherwise intact. Sensory exam intact. No obvious deformity. No blister formation. Tenderness over the fracture site and to some extent over the deltoid ligament.  Specialty Comments:  No specialty comments available.  Imaging: No results found.   PMFS History: Patient Active Problem List   Diagnosis Date Noted  . SDH (subdural hematoma) (HCC) 02/26/2016  . 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  Generalized anxiety disorder 03/21/2014   Past Medical History:  Diagnosis Date  . Anxiety   . Back pain   . Constipation   . Depression   . Diabetes mellitus, type II (HCC)   . GERD (gastroesophageal reflux disease)   . Thyroid disease     Family History  Problem Relation Age of Onset  . Depression Sister   . Alcohol abuse Brother   . Alcohol abuse Son   . Alcohol abuse Father     Past Surgical History:  Procedure Laterality Date  . ABDOMINAL HYSTERECTOMY    . BURR HOLE Left 02/26/2016   Procedure: Guss BundeBURR HOLE;  Surgeon: Julio SicksHenry Pool, MD;  [facility];  Service: Neurosurgery;  Laterality: Left;  . CATARACT EXTRACTION W/PHACO Right 10/01/2015   Procedure: CATARACT EXTRACTION PHACO AND INTRAOCULAR LENS PLACEMENT (IOC);  Surgeon: Gemma PayorKerry Hunt, MD;  Location: [facility];  Service: Ophthalmology;  Laterality: Right;  CDE:6.22  . goiter removed    . ulcer surgery     Social History   Occupational History  . Not on file  Tobacco Use  . Smoking status: Never Smoker  . Smokeless tobacco: Never Used  Substance and Sexual Activity  . Alcohol use: No  . Drug use: No  . Sexual activity: No  Garald Balding, MD   Note - This record has been created using [employer].  Chart creation errors have been sought, but may not always  have been located. Such creation errors do not reflect on  the standard of medical care.

## 2017-08-09 DIAGNOSIS — R0902 Hypoxemia: Secondary | ICD-10-CM | POA: Diagnosis not present

## 2017-08-16 ENCOUNTER — Ambulatory Visit (INDEPENDENT_AMBULATORY_CARE_PROVIDER_SITE_OTHER): Payer: Medicare Other | Admitting: Orthopaedic Surgery

## 2017-08-23 ENCOUNTER — Ambulatory Visit (INDEPENDENT_AMBULATORY_CARE_PROVIDER_SITE_OTHER): Payer: Medicare Other | Admitting: Orthopaedic Surgery

## 2017-08-23 ENCOUNTER — Ambulatory Visit (INDEPENDENT_AMBULATORY_CARE_PROVIDER_SITE_OTHER): Payer: Medicare Other

## 2017-08-23 ENCOUNTER — Encounter (INDEPENDENT_AMBULATORY_CARE_PROVIDER_SITE_OTHER): Payer: Self-pay | Admitting: Orthopaedic Surgery

## 2017-08-23 VITALS — BP 115/66 | HR 58 | Resp 12 | Ht 64.0 in | Wt 155.0 lb

## 2017-08-23 DIAGNOSIS — S82892D Other fracture of left lower leg, subsequent encounter for closed fracture with routine healing: Secondary | ICD-10-CM

## 2017-08-23 NOTE — Progress Notes (Signed)
Office Visit Note   Patient: Teresa Mccullough           Date of Birth: 08/08/1941           MRN: [id number] Visit Date: 08/23/2017              Requested by: Selinda FlavinHoward, Kevin, MD [address] [place], [place] [address] PCP: Selinda FlavinHoward, Kevin, MD   Assessment & Plan: Visit Diagnoses:  1. Closed fracture of left ankle with routine healing, subsequent encounter     Plan: 3 weeks status post minimally displaced left distal fibula fracture and doing well in cast. Cast removed. Skin intact. X-rays reveal no change of position with some early callus. Allow weightbearing as tolerated with a walker using an equalizer boot and return in 3 weeks  Follow-Up Instructions: Return in about 3 weeks (around 09/13/2017).   Orders:  Orders Placed This Encounter  Procedures  . XR Ankle Complete Left   No orders of the defined types were placed in this encounter.     Procedures: No procedures performed   Clinical Data: No additional findings.   Subjective: Chief Complaint  Patient presents with  . Left Ankle - Fracture    Teresa Mccullough is a 76 y o 3 weeks S/P closed FX of Left ankle.  Doing well without complaints in the short leg cast area denies any pain shortness of breath or calf discomfort  HPI  Review of Systems  Constitutional: Negative for chills, fatigue and fever.  Eyes: Negative for itching.  Respiratory: Negative for chest tightness and shortness of breath.   Cardiovascular: Negative for chest pain, palpitations and leg swelling.  Gastrointestinal: Negative for blood in stool, constipation and diarrhea.  Endocrine: Negative for polyuria.  Genitourinary: Negative for dysuria.  Musculoskeletal: Negative for back pain, joint swelling, neck pain and neck stiffness.  Allergic/Immunologic: Negative for immunocompromised state.  Neurological: Negative for dizziness and numbness.  Hematological: Does not bruise/bleed easily.  Psychiatric/Behavioral: The patient is not nervous/anxious.       Objective: Vital Signs: BP 115/66   Pulse (!) 58   Resp 12   Ht 5\' 4"  (1.626 m)   Wt 155 lb (70.3 kg)   BMI 26.61 kg/m   Physical Exam  Ortho Exam awake alert and oriented 3. Comfortable sitting. Cast removed. Skin intact to left lower extremity. No calf pain. Minimal swelling about left ankle as expected. No pain over the distal fibula. Excellent range of motion of ankle  Specialty Comments:  No specialty comments available.  Imaging: Xr Ankle Complete Left  Result Date: 08/23/2017 Films of the left ankle were obtained in several projections. The previously identified short oblique fracture of the distal fibula was identified and has not changed position appears to be some early callus. Ankle mortise appears to be intact    PMFS History: Patient Active Problem List   Diagnosis Date Noted  . SDH (subdural hematoma) (HCC) 02/26/2016  . 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 Generalized anxiety disorder 03/21/2014   Past Medical History:  Diagnosis Date  . Anxiety   . Back pain   . Constipation   . Depression   . Diabetes mellitus, type II (HCC)   . GERD (gastroesophageal reflux disease)   . Thyroid disease     Family History  Problem Relation Age of Onset  . Depression Sister   . Alcohol abuse Brother   . Alcohol abuse Son   . Alcohol abuse Father     Past  Surgical History:  Procedure Laterality Date  . ABDOMINAL HYSTERECTOMY    . BURR HOLE Left 02/26/2016   Procedure: Guss BundeBURR HOLE;  Surgeon: Julio SicksHenry Pool, MD;  [facility];  Service: Neurosurgery;  Laterality: Left;  . CATARACT EXTRACTION W/PHACO Right 10/01/2015   Procedure: CATARACT EXTRACTION PHACO AND INTRAOCULAR LENS PLACEMENT (IOC);  Surgeon: Gemma PayorKerry Hunt, MD;  Location: [facility] ORS;  Service: Ophthalmology;  Laterality: Right;  CDE:6.22  . goiter removed    . ulcer surgery     Social History   Occupational History  . Not on file  Tobacco Use  . Smoking status: Never Smoker  .  Smokeless tobacco: Never Used  Substance and Sexual Activity  . Alcohol use: No  . Drug use: No  . Sexual activity: No

## 2017-09-09 DIAGNOSIS — R0902 Hypoxemia: Secondary | ICD-10-CM | POA: Diagnosis not present

## 2017-09-13 ENCOUNTER — Ambulatory Visit (INDEPENDENT_AMBULATORY_CARE_PROVIDER_SITE_OTHER): Payer: Medicare Other

## 2017-09-13 ENCOUNTER — Ambulatory Visit (INDEPENDENT_AMBULATORY_CARE_PROVIDER_SITE_OTHER): Payer: Medicare Other | Admitting: Orthopaedic Surgery

## 2017-09-13 ENCOUNTER — Encounter (INDEPENDENT_AMBULATORY_CARE_PROVIDER_SITE_OTHER): Payer: Self-pay | Admitting: Orthopaedic Surgery

## 2017-09-13 VITALS — BP 96/55 | HR 71 | Resp 16 | Ht 63.0 in | Wt 165.0 lb

## 2017-09-13 DIAGNOSIS — S82892D Other fracture of left lower leg, subsequent encounter for closed fracture with routine healing: Secondary | ICD-10-CM

## 2017-09-13 NOTE — Progress Notes (Signed)
Office Visit Note   Patient: Teresa GelinasLucille J Mabey           Date of Birth: 06/10/1941           MRN: [id number] Visit Date: 09/13/2017              Requested by: Selinda FlavinHoward, Kevin, MD [address] [place], [place] [address] PCP: Selinda FlavinHoward, Kevin, MD   Assessment & Plan: Visit Diagnoses:  1. Closed fracture of left ankle with routine healing, subsequent encounter     Plan: Films demonstrate a healing of the distal fibula fracture without any change in position. Ankle mortise is intact. Main now bear weight to tolerance with a regular shoe and we'll see back as necessary  Follow-Up Instructions: Return if symptoms worsen or fail to improve.   Orders:  Orders Placed This Encounter  Procedures  . XR Ankle Complete Left   No orders of the defined types were placed in this encounter.     Procedures: No procedures performed   Clinical Data: No additional findings.   Subjective: Chief Complaint  Patient presents with  . Left Ankle - Follow-up  6 weeks status post left ankle fracture as previously identified. There is an oblique distal fibula fracture just above the ankle joint. There is slight displacement but the ankle mortise appears to be intact. Fracture appears to heal by film with no significant pain. Has been full weightbearing in the equalizer boot  HPI  Review of Systems  Constitutional: Negative for chills, fatigue and fever.  Eyes: Negative for itching.  Respiratory: Negative for chest tightness and shortness of breath.   Cardiovascular: Negative for chest pain, palpitations and leg swelling.  Gastrointestinal: Negative for blood in stool, constipation and diarrhea.  Endocrine: Negative for polyuria.  Genitourinary: Negative for dysuria.  Musculoskeletal: Positive for back pain and joint swelling. Negative for neck pain and neck stiffness.  Allergic/Immunologic: Negative for immunocompromised state.  Neurological: Negative for dizziness and numbness.  Hematological: Does not  bruise/bleed easily.  Psychiatric/Behavioral: The patient is not nervous/anxious.      Objective: Vital Signs: BP (!) 96/55   Pulse 71   Resp 16   Ht 5\' 3"  (1.6 m)   Wt 165 lb (74.8 kg)   BMI 29.23 kg/m   Physical Exam  Ortho Exam equalizer boot removed. Minimal swelling of left ankle. No significant tenderness over the distal fibula. Excellent range of motion. No obvious instability. No pain behind either malleolus. Skin intact.  Specialty Comments:  No specialty comments available.  Imaging: Xr Ankle Complete Left  Result Date: 09/13/2017 Films of the left ankle obtained in several projections. The previously identified short oblique distal fibula fracture appears to have healed with abundant callus. The ankle mortise is intact.    PMFS History: Patient Active Problem List   Diagnosis Date Noted  . SDH (subdural hematoma) (HCC) 02/26/2016  . 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 Generalized anxiety disorder 03/21/2014   Past Medical History:  Diagnosis Date  . Anxiety   . Back pain   . Constipation   . Depression   . Diabetes mellitus, type II (HCC)   . GERD (gastroesophageal reflux disease)   . Thyroid disease     Family History  Problem Relation Age of Onset  . Depression Sister   . Alcohol abuse Brother   . Alcohol abuse Son   . Alcohol abuse Father     Past Surgical History:  Procedure Laterality Date  . ABDOMINAL HYSTERECTOMY    .  BURR HOLE Left 02/26/2016   Procedure: Guss Bunde;  Surgeon: Julio Sicks, MD;  Location: [facility] NEURO ORS;  Service: Neurosurgery;  Laterality: Left;  . CATARACT EXTRACTION W/PHACO Right 10/01/2015   Procedure: CATARACT EXTRACTION PHACO AND INTRAOCULAR LENS PLACEMENT (IOC);  Surgeon: Gemma Payor, MD;  Location: [facility] ORS;  Service: Ophthalmology;  Laterality: Right;  CDE:6.22  . goiter removed    . ulcer surgery     Social History   Occupational History  . Not on file  Tobacco Use  . Smoking status: Never Smoker    . Smokeless tobacco: Never Used  Substance and Sexual Activity  . Alcohol use: No  . Drug use: No  . Sexual activity: No

## 2017-10-10 DIAGNOSIS — R0902 Hypoxemia: Secondary | ICD-10-CM | POA: Diagnosis not present

## 2017-10-15 DIAGNOSIS — E039 Hypothyroidism, unspecified: Secondary | ICD-10-CM | POA: Diagnosis not present

## 2017-10-15 DIAGNOSIS — Z79899 Other long term (current) drug therapy: Secondary | ICD-10-CM | POA: Diagnosis not present

## 2017-10-15 DIAGNOSIS — E119 Type 2 diabetes mellitus without complications: Secondary | ICD-10-CM | POA: Diagnosis not present

## 2017-10-15 DIAGNOSIS — S82845A Nondisplaced bimalleolar fracture of left lower leg, initial encounter for closed fracture: Secondary | ICD-10-CM | POA: Diagnosis not present

## 2017-10-15 DIAGNOSIS — K219 Gastro-esophageal reflux disease without esophagitis: Secondary | ICD-10-CM | POA: Diagnosis not present

## 2017-10-15 DIAGNOSIS — S8262XA Displaced fracture of lateral malleolus of left fibula, initial encounter for closed fracture: Secondary | ICD-10-CM | POA: Diagnosis not present

## 2017-10-15 DIAGNOSIS — Z7984 Long term (current) use of oral hypoglycemic drugs: Secondary | ICD-10-CM | POA: Diagnosis not present

## 2017-10-15 DIAGNOSIS — S8252XA Displaced fracture of medial malleolus of left tibia, initial encounter for closed fracture: Secondary | ICD-10-CM | POA: Diagnosis not present

## 2017-10-15 DIAGNOSIS — S8992XA Unspecified injury of left lower leg, initial encounter: Secondary | ICD-10-CM | POA: Diagnosis not present

## 2017-10-18 ENCOUNTER — Ambulatory Visit (INDEPENDENT_AMBULATORY_CARE_PROVIDER_SITE_OTHER): Payer: Medicare Other | Admitting: Orthopaedic Surgery

## 2017-10-18 DIAGNOSIS — S82842A Displaced bimalleolar fracture of left lower leg, initial encounter for closed fracture: Secondary | ICD-10-CM

## 2017-10-18 NOTE — Progress Notes (Signed)
Office Visit Note   Patient: Teresa Mccullough           Date of Birth: 02/16/1941           MRN: [id number] Visit Date: 10/18/2017              Requested by: Teresa FlavinHoward, Kevin, MD [address] [place], [place] [address] PCP: Teresa FlavinHoward, Kevin, MD   Assessment & Plan: Visit Diagnoses:  1. Closed bimalleolar fracture of left ankle, initial encounter     Plan: Mrs. Teresa Mccullough stain injury to her left ankle this past Sunday while attending a church. Her foot rolled under the car which she was exiting with an inversion stress. She had immediate onset of pain and was taken to [facility] room. Domes demonstrated a bimalleolar fracture. She was placed in a posterior splint and asked to follow up in the office today. She's been comfortable taking combinations of ibuprofen and Tylenol. She recently had been treated for a nondisplaced fracture of that same distal fibula with healing. I have reviewed the films on the PACS system and she does have a displaced bimalleolar fracture. I think this needs to have open reduction internal fixation. There was some excoriated skin along the medial aspect of her ankle. Of applied meter Propacet and we'll wait till the first of the week to reevaluate evaluate the skin at which point we'll proceed with open reduction internal fixation.  Follow-Up Instructions: Return will schedule surgery.   Orders:  No orders of the defined types were placed in this encounter.  No orders of the defined types were placed in this encounter.     Procedures: No procedures performed   Clinical Data: No additional findings.   Subjective: Mrs Teresa Mccullough is accompanied by a family member and here for evaluation of a new injury to her left ankle.Marland Kitchen. She was at church Sunday when she rolled her left ankle underneath the rolling car. She had immediate onset of pain. Was seen at the [facility] emergency room with films demonstrating a displaced bimalleolar fracture. She was  placed in a posterior splint and asked to follow up in the office today. She's has been comfortable taking ibuprofen and Tylenol for pain. [name] just completed fracture treatment of the same lateral malleolus left ankle a month ago with excellent healing. I did review the films and the PACS system and there is a displaced medial malleolar fracture, short oblique fracture separated by several millimeters. There is also some further displacement of the lateral malleolus fracture with some widening of the ankle mortise laterally  HPI  Review of Systems   Objective: Vital Signs: There were no vitals taken for this visit.  Physical Exam  Ortho Exam short leg splint was removed. Skin internal lateral aspect of the ankle was intact but there is some excoriated skin medially in the area where she should have the incision for the medial malleolar fracture. There was no redness note and it was quite superficial. Of apply ibuprofen is in a sterile dressing and then reapplied a short leg splint. Neurovascular exam was intact. There was mild edema was some ecchymosis .neurovascular exam was intact.  Specialty Comments:  No specialty comments available.  Imaging: No results found.   PMFS History: Patient Active Problem List   Diagnosis Date Noted  . SDH (subdural hematoma) (HCC) 02/26/2016  . 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 Generalized anxiety disorder 03/21/2014   Past Medical History:  Diagnosis Date  .  Anxiety   . Back pain   . Constipation   . Depression   . Diabetes mellitus, type II (HCC)   . GERD (gastroesophageal reflux disease)   . Thyroid disease     Family History  Problem Relation Age of Onset  . Depression Sister   . Alcohol abuse Brother   . Alcohol abuse Son   . Alcohol abuse Father     Past Surgical History:  Procedure Laterality Date  . ABDOMINAL HYSTERECTOMY    . BURR HOLE Left 02/26/2016   Procedure: Guss Bunde;  Surgeon: Julio Sicks,  MD;  Location: [facility] NEURO ORS;  Service: Neurosurgery;  Laterality: Left;  . CATARACT EXTRACTION W/PHACO Right 10/01/2015   Procedure: CATARACT EXTRACTION PHACO AND INTRAOCULAR LENS PLACEMENT (IOC);  Surgeon: Gemma Payor, MD;  Location: [facility] ORS;  Service: Ophthalmology;  Laterality: Right;  CDE:6.22  . goiter removed    . ulcer surgery     Social History   Occupational History  . Not on file  Tobacco Use  . Smoking status: Never Smoker  . Smokeless tobacco: Never Used  Substance and Sexual Activity  . Alcohol use: No  . Drug use: No  . Sexual activity: No     Valeria Batman, MD   Note - This record has been created using [employer].  Chart creation errors have been sought, but may not always  have been located. Such creation errors do not reflect on  the standard of medical care.

## 2017-10-19 ENCOUNTER — Telehealth (INDEPENDENT_AMBULATORY_CARE_PROVIDER_SITE_OTHER): Payer: Self-pay | Admitting: Orthopaedic Surgery

## 2017-10-19 NOTE — Telephone Encounter (Signed)
Please advise 

## 2017-10-19 NOTE — Telephone Encounter (Signed)
I called patient's granddaughter Lelon MastSamantha to let her know information regarding [facility].  The Granddaughter is asking if you know whether or not xrays for the LEFT HIP/LEG/KNEE  were obtained at [facility].  Lelon MastSamantha states that her grandmother is complaining of pain in the leg, basically the knee up to the hip and thinks there could be more injury than just the ankle.  Please advise as to what she can do to get her  Grandmother comfortable.    cb  [phone]

## 2017-10-19 NOTE — Telephone Encounter (Signed)
called

## 2017-10-24 ENCOUNTER — Encounter: Payer: Self-pay | Admitting: Orthopaedic Surgery

## 2017-10-24 DIAGNOSIS — S82842A Displaced bimalleolar fracture of left lower leg, initial encounter for closed fracture: Secondary | ICD-10-CM | POA: Diagnosis not present

## 2017-10-24 DIAGNOSIS — G8918 Other acute postprocedural pain: Secondary | ICD-10-CM | POA: Diagnosis not present

## 2017-11-01 ENCOUNTER — Encounter (INDEPENDENT_AMBULATORY_CARE_PROVIDER_SITE_OTHER): Payer: Self-pay | Admitting: Orthopaedic Surgery

## 2017-11-01 ENCOUNTER — Ambulatory Visit (INDEPENDENT_AMBULATORY_CARE_PROVIDER_SITE_OTHER): Payer: Medicare Other | Admitting: Orthopaedic Surgery

## 2017-11-01 ENCOUNTER — Ambulatory Visit (INDEPENDENT_AMBULATORY_CARE_PROVIDER_SITE_OTHER): Payer: Medicare Other

## 2017-11-01 DIAGNOSIS — S82842D Displaced bimalleolar fracture of left lower leg, subsequent encounter for closed fracture with routine healing: Secondary | ICD-10-CM | POA: Diagnosis not present

## 2017-11-01 NOTE — Progress Notes (Signed)
   Office Visit Note   Patient: Teresa Mccullough           Date of Birth: 08/29/1941           MRN: [id number] Visit Date: 11/01/2017              Requested by: Selinda FlavinHoward, Kevin, MD [address] [place], [place] [address] PCP: Selinda FlavinHoward, Kevin, MD   Assessment & Plan: Visit Diagnoses:  1. Closed bimalleolar fracture of left ankle with routine healing, subsequent encounter     Plan: Eight-day status post open reduction and internal fixation of left bimalleolar ankle fracture. Doing well. We will remove the surgical clips and apply Steri-Strips. Films reveal excellent position of the fracture. We will apply equalizer boot and return in 2 weeks no weightbearing  Follow-Up Instructions: Return in about 2 years (around 11/02/2019).   Orders:  No orders of the defined types were placed in this encounter.  No orders of the defined types were placed in this encounter.     Procedures: No procedures performed   Clinical Data: No additional findings.   Subjective: No chief complaint on file. Eight-day status post open reduction and internal fixation of bimalleolar fracture left ankle. Doing well in splint. No related fever or chills.  HPI  Review of Systems   Objective: Vital Signs: There were no vitals taken for this visit.  Physical Exam  Ortho Exam awake alert and oriented 3. Appears comfortable. Splint removed from left lower extremity. Wounds look fine. Clips removed and Steri-Strips applied. Minimal swelling about the ankle. Neurovascular exam intact. No obvious deformity  Specialty Comments:  No specialty comments available.  Imaging: No results found.   PMFS History: Patient Active Problem List   Diagnosis Date Noted  . SDH (subdural hematoma) (HCC) 02/26/2016  . 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 Generalized anxiety disorder 03/21/2014   Past Medical History:  Diagnosis Date  . Anxiety   . Back pain   . Constipation   . Depression   . Diabetes  mellitus, type II (HCC)   . GERD (gastroesophageal reflux disease)   . Thyroid disease     Family History  Problem Relation Age of Onset  . Depression Sister   . Alcohol abuse Brother   . Alcohol abuse Son   . Alcohol abuse Father     Past Surgical History:  Procedure Laterality Date  . ABDOMINAL HYSTERECTOMY    . BURR HOLE Left 02/26/2016   Procedure: Guss BundeBURR HOLE;  Surgeon: Julio SicksHenry Pool, MD;  [facility];  Service: Neurosurgery;  Laterality: Left;  . CATARACT EXTRACTION W/PHACO Right 10/01/2015   Procedure: CATARACT EXTRACTION PHACO AND INTRAOCULAR LENS PLACEMENT (IOC);  Surgeon: Gemma PayorKerry Hunt, MD;  Location: [facility] ORS;  Service: Ophthalmology;  Laterality: Right;  CDE:6.22  . goiter removed    . ulcer surgery     Social History   Occupational History  . Not on file  Tobacco Use  . Smoking status: Never Smoker  . Smokeless tobacco: Never Used  Substance and Sexual Activity  . Alcohol use: No  . Drug use: No  . Sexual activity: No     Valeria BatmanPeter W Deloyce Walthers, MD   Note - This record has been created using [employer] software.  Chart creation errors have been sought, but may not always  have been located. Such creation errors do not reflect on  the standard of medical care.

## 2017-11-07 DIAGNOSIS — R0902 Hypoxemia: Secondary | ICD-10-CM | POA: Diagnosis not present

## 2017-11-15 ENCOUNTER — Encounter (INDEPENDENT_AMBULATORY_CARE_PROVIDER_SITE_OTHER): Payer: Self-pay | Admitting: Orthopaedic Surgery

## 2017-11-15 ENCOUNTER — Ambulatory Visit (INDEPENDENT_AMBULATORY_CARE_PROVIDER_SITE_OTHER): Payer: Medicare Other | Admitting: Orthopaedic Surgery

## 2017-11-15 VITALS — BP 111/65 | HR 74 | Ht 64.0 in | Wt 170.0 lb

## 2017-11-15 DIAGNOSIS — S82842D Displaced bimalleolar fracture of left lower leg, subsequent encounter for closed fracture with routine healing: Secondary | ICD-10-CM

## 2017-11-15 NOTE — Progress Notes (Signed)
Office Visit Note   Patient: Teresa Mccullough           Date of Birth: [birth year]           MRN: [id number] Visit Date: 11/15/2017              Requested by: Selinda Flavin, MD [address] [place], [place] [address] PCP: Selinda Flavin, MD   Assessment & Plan: Visit Diagnoses:  1. Closed bimalleolar fracture of left ankle with routine healing, subsequent encounter     Plan: 3 weeks status post open reduction and internal fixation of bimalleolar fracture left ankle.  Doing very well in equalizer boot and minimal weightbearing using a walker.  Continue with limited weightbearing for 3 more weeks.  Return and repeat films at that point.  Re new tramadol 50 mg #30 Follow-Up Instructions: Return in about 3 weeks (around 12/06/2017).   Orders:  No orders of the defined types were placed in this encounter.  No orders of the defined types were placed in this encounter.     Procedures: No procedures performed   Clinical Data: No additional findings.   Subjective: No chief complaint on file. 3 weeks status post ORIF bimalleolar fracture left ankle.  Doing well.  Accompanied by family.  No related issues.  Taking tramadol once a day.  Denies shortness of breath or chest pain.  HPI  Review of Systems  Constitutional: Positive for fatigue. Negative for fever.  HENT: Negative for ear pain.   Eyes: Negative for pain.  Respiratory: Negative for cough and shortness of breath.   Cardiovascular: Negative for leg swelling.  Gastrointestinal: Positive for constipation. Negative for blood in stool and diarrhea.  Genitourinary: Negative for dysuria.  Musculoskeletal: Positive for back pain. Negative for neck pain.  Allergic/Immunologic: Negative for food allergies.  Neurological: Positive for weakness and numbness. Negative for dizziness, light-headedness and headaches.  Psychiatric/Behavioral: Negative for sleep disturbance.     Objective: Vital Signs: BP 111/65 (BP Location: Left Arm,  Patient Position: Sitting, Cuff Size: Normal)   Pulse 74   Ht 5\' 4"  (1.626 m)   Wt 170 lb (77.1 kg)   BMI 29.18 kg/m   Physical Exam  Ortho Exam left ankle examined out of the equalizer boot.  Minimal swelling.  Incisions are healing without problem.  The refill to toes.  Patient relates that she has some "numbness" but seems to have normal sensibility in the dorsal and plantar aspect of her feet.  No deformity about the ankle.  Minimal pain.  Specialty Comments:  No specialty comments available.  Imaging: No results found.   PMFS History: Patient Active Problem List   Diagnosis Date Noted  . SDH (subdural hematoma) (HCC) 02/26/2016  . 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 Generalized anxiety disorder 03/21/2014   Past Medical History:  Diagnosis Date  . Anxiety   . Back pain   . Constipation   . Depression   . Diabetes mellitus, type II (HCC)   . GERD (gastroesophageal reflux disease)   . Thyroid disease     Family History  Problem Relation Age of Onset  . Depression Sister   . Alcohol abuse Brother   . Alcohol abuse Son   . Alcohol abuse Father     Past Surgical History:  Procedure Laterality Date  . ABDOMINAL HYSTERECTOMY    . BURR HOLE Left 02/26/2016   Procedure: Guss Bunde;  Surgeon: Julio Sicks, MD;  Location: [facility] NEURO ORS;  Service: Neurosurgery;  Laterality: Left;  . CATARACT EXTRACTION W/PHACO Right 10/01/2015   Procedure: CATARACT EXTRACTION PHACO AND INTRAOCULAR LENS PLACEMENT (IOC);  Surgeon: Gemma PayorKerry Hunt, MD;  Location: [facility] ORS;  Service: Ophthalmology;  Laterality: Right;  CDE:6.22  . goiter removed    . ulcer surgery     Social History   Occupational History  . Not on file  Tobacco Use  . Smoking status: Never Smoker  . Smokeless tobacco: Never Used  Substance and Sexual Activity  . Alcohol use: No  . Drug use: No  . Sexual activity: No

## 2017-12-06 ENCOUNTER — Ambulatory Visit (INDEPENDENT_AMBULATORY_CARE_PROVIDER_SITE_OTHER): Payer: Medicare Other | Admitting: Orthopaedic Surgery

## 2017-12-06 ENCOUNTER — Encounter (INDEPENDENT_AMBULATORY_CARE_PROVIDER_SITE_OTHER): Payer: Self-pay | Admitting: Orthopaedic Surgery

## 2017-12-06 ENCOUNTER — Ambulatory Visit (INDEPENDENT_AMBULATORY_CARE_PROVIDER_SITE_OTHER): Payer: Medicare Other

## 2017-12-06 VITALS — BP 108/79 | HR 79 | Resp 16

## 2017-12-06 DIAGNOSIS — M25572 Pain in left ankle and joints of left foot: Secondary | ICD-10-CM | POA: Diagnosis not present

## 2017-12-06 NOTE — Progress Notes (Signed)
Office Visit Note   Patient: Teresa Mccullough           Date of Birth: [birth year]           MRN: [id number] Visit Date: 12/06/2017              Requested by: Selinda Flavin, MD [address] [place], [place] [address] PCP: Selinda Flavin, MD   Assessment & Plan: Visit Diagnoses:  1. Pain in left ankle and joints of left foot     Plan: 6 weeks status post open reduction internal fixation of bimalleolar fracture left ankle.  Daughter accompanies her.  Apparently doing well.  Partially weightbearing in equalizer boot.  We will start full weightbearing in the boot with home health physical therapy.  return to the office 2 weeks  Follow-Up Instructions: Return in about 2 weeks (around 12/20/2017).   Orders:  Orders Placed This Encounter  Procedures  . XR Ankle Complete Left   No orders of the defined types were placed in this encounter.     Procedures: No procedures performed   Clinical Data: No additional findings.   Subjective: Chief Complaint  Patient presents with  . Left Ankle - Follow-up, Fracture    10-24-17 SURGERY ON LEFT ANKLE   . Follow-up    L ANKLE FX F/U  , Teresa Mccullough is now 6 weeks post ORIF bimalleolar fracture left ankle.  Doing well in terms of pain.  Still partially weightbearing in the equalizer boot.  HPI  Review of Systems  Constitutional: Negative for fever.  HENT: Negative for ear pain.   Eyes: Negative for pain.  Respiratory: Negative for cough.   Cardiovascular: Positive for leg swelling.  Gastrointestinal: Negative for constipation and diarrhea.  Genitourinary: Negative for difficulty urinating.  Musculoskeletal: Negative for back pain.  Skin: Negative for rash.  Allergic/Immunologic: Negative for food allergies.  Neurological: Positive for weakness and numbness. Negative for dizziness and headaches.  Hematological: Bruises/bleeds easily.  Psychiatric/Behavioral: Negative for sleep disturbance.     Objective: Vital Signs: BP 108/79 (BP  Location: Left Arm, Patient Position: Sitting, Cuff Size: Normal)   Pulse 79   Resp 16   Physical Exam  Ortho Exam wake and alert and oriented x3.  Comfortable sitting.  Repeat films as mentioned above of the left ankle reveal good position of the hardware without any complications.  Family relates that the wounds are healing well there are no problems with her skin.  Specialty Comments:  No specialty comments available.  Imaging: Xr Ankle Complete Left  Result Date: 12/06/2017 Films of the left ankle were obtained in 3 projections.  The medial lateral malleolus I have been internally fixed with screws medially and a plate and screws laterally.  Ankle mortise is intact.  Obvious complications.  Difficult to tell if fractures have healed yet    PMFS History: Patient Active Problem List   Diagnosis Date Noted  . SDH (subdural hematoma) (HCC) 02/26/2016  . 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 Generalized anxiety disorder 03/21/2014   Past Medical History:  Diagnosis Date  . Anxiety   . Back pain   . Constipation   . Depression   . Diabetes mellitus, type II (HCC)   . GERD (gastroesophageal reflux disease)   . Thyroid disease     Family History  Problem Relation Age of Onset  . Depression Sister   . Alcohol abuse Brother   . Alcohol abuse Son   . Alcohol abuse Father  Past Surgical History:  Procedure Laterality Date  . ABDOMINAL HYSTERECTOMY    . BURR HOLE Left 02/26/2016   Procedure: Guss BundeBURR HOLE;  Surgeon: Julio SicksHenry Pool, MD;  [facility];  Service: Neurosurgery;  Laterality: Left;  . CATARACT EXTRACTION W/PHACO Right 10/01/2015   Procedure: CATARACT EXTRACTION PHACO AND INTRAOCULAR LENS PLACEMENT (IOC);  Surgeon: Gemma PayorKerry Hunt, MD;  Location: [facility];  Service: Ophthalmology;  Laterality: Right;  CDE:6.22  . goiter removed    . ulcer surgery     Social History   Occupational History  . Not on file  Tobacco Use  . Smoking status: Never Smoker    . Smokeless tobacco: Never Used  Substance and Sexual Activity  . Alcohol use: No  . Drug use: No  . Sexual activity: Never

## 2017-12-08 DIAGNOSIS — R0902 Hypoxemia: Secondary | ICD-10-CM | POA: Diagnosis not present

## 2017-12-11 DIAGNOSIS — Z4789 Encounter for other orthopedic aftercare: Secondary | ICD-10-CM | POA: Diagnosis not present

## 2017-12-11 DIAGNOSIS — Z7984 Long term (current) use of oral hypoglycemic drugs: Secondary | ICD-10-CM | POA: Diagnosis not present

## 2017-12-11 DIAGNOSIS — Z79891 Long term (current) use of opiate analgesic: Secondary | ICD-10-CM | POA: Diagnosis not present

## 2017-12-15 DIAGNOSIS — Z79891 Long term (current) use of opiate analgesic: Secondary | ICD-10-CM | POA: Diagnosis not present

## 2017-12-15 DIAGNOSIS — Z7984 Long term (current) use of oral hypoglycemic drugs: Secondary | ICD-10-CM | POA: Diagnosis not present

## 2017-12-15 DIAGNOSIS — Z4789 Encounter for other orthopedic aftercare: Secondary | ICD-10-CM | POA: Diagnosis not present

## 2017-12-17 DIAGNOSIS — Z7984 Long term (current) use of oral hypoglycemic drugs: Secondary | ICD-10-CM | POA: Diagnosis not present

## 2017-12-17 DIAGNOSIS — Z4789 Encounter for other orthopedic aftercare: Secondary | ICD-10-CM | POA: Diagnosis not present

## 2017-12-17 DIAGNOSIS — Z79891 Long term (current) use of opiate analgesic: Secondary | ICD-10-CM | POA: Diagnosis not present

## 2017-12-19 DIAGNOSIS — Z79891 Long term (current) use of opiate analgesic: Secondary | ICD-10-CM | POA: Diagnosis not present

## 2017-12-19 DIAGNOSIS — Z4789 Encounter for other orthopedic aftercare: Secondary | ICD-10-CM | POA: Diagnosis not present

## 2017-12-19 DIAGNOSIS — Z7984 Long term (current) use of oral hypoglycemic drugs: Secondary | ICD-10-CM | POA: Diagnosis not present

## 2017-12-20 ENCOUNTER — Ambulatory Visit (INDEPENDENT_AMBULATORY_CARE_PROVIDER_SITE_OTHER): Payer: Medicare Other | Admitting: Orthopaedic Surgery

## 2017-12-20 ENCOUNTER — Encounter (INDEPENDENT_AMBULATORY_CARE_PROVIDER_SITE_OTHER): Payer: Self-pay | Admitting: Orthopaedic Surgery

## 2017-12-20 VITALS — BP 120/75 | HR 67

## 2017-12-20 DIAGNOSIS — S82842D Displaced bimalleolar fracture of left lower leg, subsequent encounter for closed fracture with routine healing: Secondary | ICD-10-CM

## 2017-12-20 DIAGNOSIS — S82842A Displaced bimalleolar fracture of left lower leg, initial encounter for closed fracture: Secondary | ICD-10-CM | POA: Insufficient documentation

## 2017-12-20 NOTE — Progress Notes (Signed)
Office Visit Note   Patient: Teresa Mccullough           Date of Birth: 07/16/1941           MRN: [id number] Visit Date: 12/20/2017              Requested by: Selinda FlavinHoward, Kevin, MD [address] [place], [place] [id number] PCP: Selinda FlavinHoward, Kevin, MD   Assessment & Plan: Visit Diagnoses:  1. Closed bimalleolar fracture of left ankle with routine healing, subsequent encounter     Plan: 2 months status post open reduction internal fixation of a bimalleolar fracture left ankle.  Doing well.  Accompanied by her granddaughter.  Has been fully weightbearing in the equalizer boot using a walker not a problem.  Now move the the boot and just use a walker.  Continues with physical therapy.  Office 1 month  Follow-Up Instructions: Return in about 1 month (around 01/19/2018).   Orders:  No orders of the defined types were placed in this encounter.  No orders of the defined types were placed in this encounter.     Procedures: No procedures performed   Clinical Data: No additional findings.   Subjective: Chief Complaint  Patient presents with  . Left Ankle - Follow-up  Patient returns for follow up status post ORIF bimalleolar fracture of the left ankle on 10/24/17.  She states that she is getting better and PT is working on ROM and strengthening. She does have some soreness in her left knee. She is full weightbearing in the CAM boot and takes tramadol occasionally for pain.  HPI  Review of Systems   Objective: Vital Signs: BP 120/75   Pulse 67   Physical Exam  Ortho Exam awake alert and oriented.  Slow mentation.  Left ankle wounds of healing without problem.  Sensibility intact.  Some limited dorsiflexion and plantarflexion of her ankle.  No significant pain to palpation.  No swelling.  Good capillary refill to toes  Specialty Comments:  No specialty comments available.  Imaging: No results found.   PMFS History: Patient Active Problem List   Diagnosis Date Noted  . Closed  bimalleolar fracture of left ankle 12/20/2017  . SDH (subdural hematoma) (HCC) 02/26/2016  . 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 Generalized anxiety disorder 03/21/2014   Past Medical History:  Diagnosis Date  . Anxiety   . Back pain   . Constipation   . Depression   . Diabetes mellitus, type II (HCC)   . GERD (gastroesophageal reflux disease)   . Thyroid disease     Family History  Problem Relation Age of Onset  . Depression Sister   . Alcohol abuse Brother   . Alcohol abuse Son   . Alcohol abuse Father     Past Surgical History:  Procedure Laterality Date  . ABDOMINAL HYSTERECTOMY    . BURR HOLE Left 02/26/2016   Procedure: Guss BundeBURR HOLE;  Surgeon: Julio SicksHenry Pool, MD;  [facility];  Service: Neurosurgery;  Laterality: Left;  . CATARACT EXTRACTION W/PHACO Right 10/01/2015   Procedure: CATARACT EXTRACTION PHACO AND INTRAOCULAR LENS PLACEMENT (IOC);  Surgeon: Gemma PayorKerry Hunt, MD;  Location: [facility];  Service: Ophthalmology;  Laterality: Right;  CDE:6.22  . goiter removed    . ulcer surgery     Social History   Occupational History  . Not on file  Tobacco Use  . Smoking status: Never Smoker  . Smokeless tobacco: Never Used  Substance and Sexual Activity  . Alcohol  use: No  . Drug use: No  . Sexual activity: Never

## 2017-12-25 DIAGNOSIS — Z7984 Long term (current) use of oral hypoglycemic drugs: Secondary | ICD-10-CM | POA: Diagnosis not present

## 2017-12-25 DIAGNOSIS — Z4789 Encounter for other orthopedic aftercare: Secondary | ICD-10-CM | POA: Diagnosis not present

## 2017-12-25 DIAGNOSIS — Z79891 Long term (current) use of opiate analgesic: Secondary | ICD-10-CM | POA: Diagnosis not present

## 2017-12-28 DIAGNOSIS — Z79891 Long term (current) use of opiate analgesic: Secondary | ICD-10-CM | POA: Diagnosis not present

## 2017-12-28 DIAGNOSIS — Z7984 Long term (current) use of oral hypoglycemic drugs: Secondary | ICD-10-CM | POA: Diagnosis not present

## 2017-12-28 DIAGNOSIS — Z4789 Encounter for other orthopedic aftercare: Secondary | ICD-10-CM | POA: Diagnosis not present

## 2018-01-07 DIAGNOSIS — R0902 Hypoxemia: Secondary | ICD-10-CM | POA: Diagnosis not present

## 2018-01-17 ENCOUNTER — Ambulatory Visit (INDEPENDENT_AMBULATORY_CARE_PROVIDER_SITE_OTHER): Payer: Medicare Other | Admitting: Orthopaedic Surgery

## 2018-02-07 DIAGNOSIS — R0902 Hypoxemia: Secondary | ICD-10-CM | POA: Diagnosis not present

## 2018-03-09 DIAGNOSIS — R0902 Hypoxemia: Secondary | ICD-10-CM | POA: Diagnosis not present

## 2018-03-25 IMAGING — CT CT HEAD W/O CM
3 series · 15 of 47 positions shown, 18 images · non-contrast
Comparison: CT HEAD February 26, 2016

CLINICAL DATA: Follow-up posttraumatic subdural hematoma.

EXAM:
CT HEAD WITHOUT CONTRAST
TECHNIQUE: Contiguous axial images were obtained from the base of the skull
through the vertex without intravenous contrast.

[Series 2: head 5.0 h30s · axial · 0.43mm/px · z∈[-99,+41]mm · 9 of 34 slices shown, 12 images]
[im 3/34  brain]
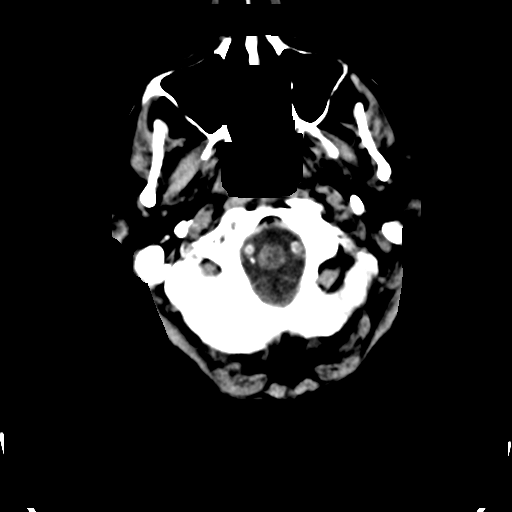
[im 3/34  bone]
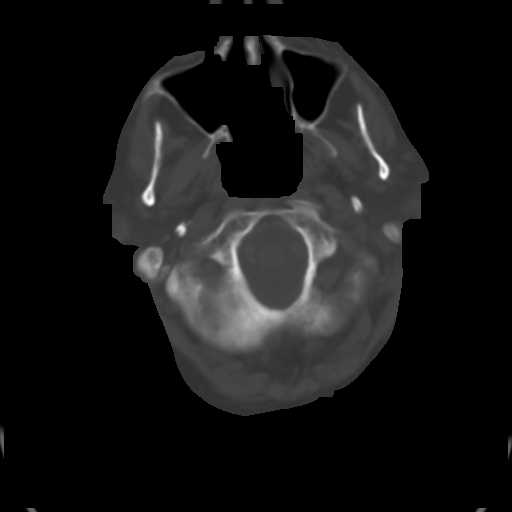
[im 6/34  brain]
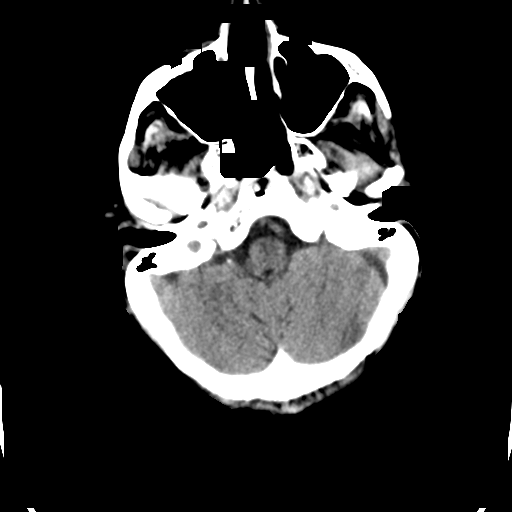
[im 10/34  brain]
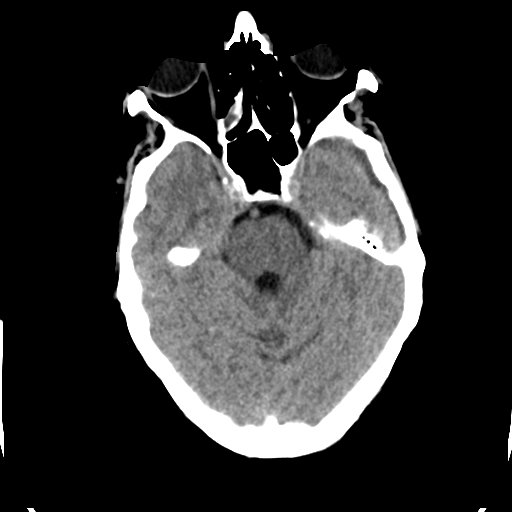
[im 13/34  brain]
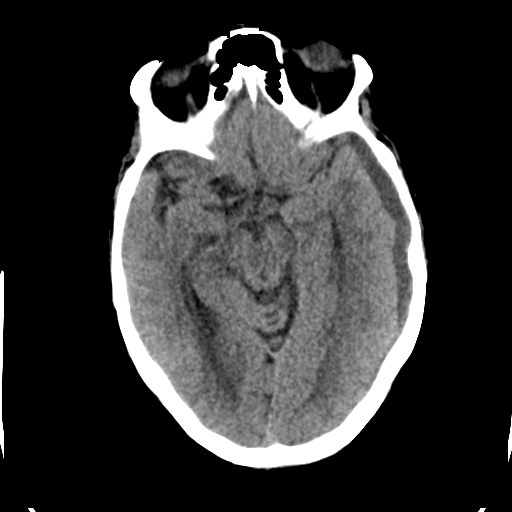
[im 18/34  brain]
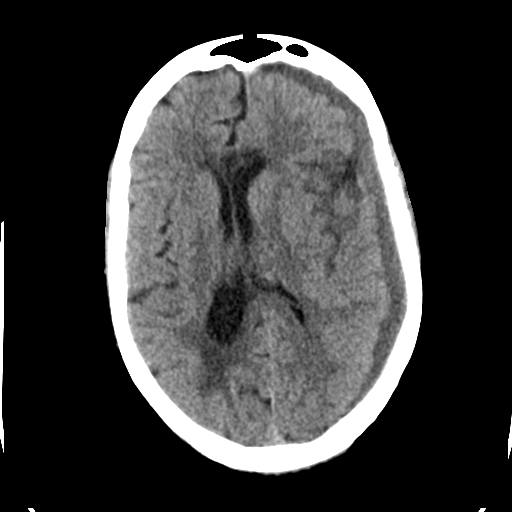
[im 18/34  bone]
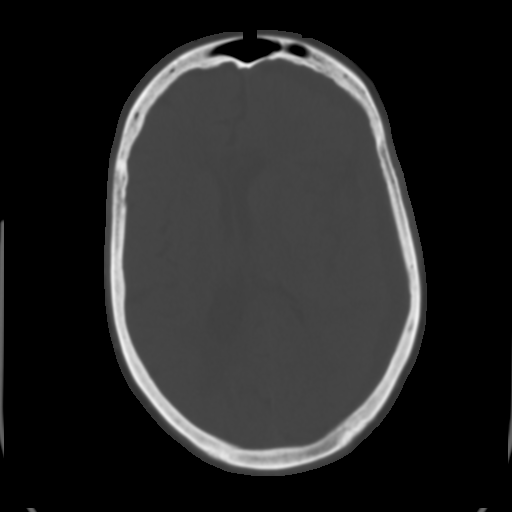
[im 21/34  brain]
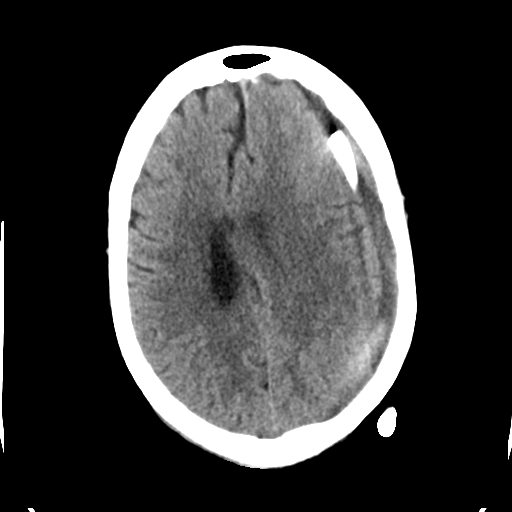
[im 24/34  brain]
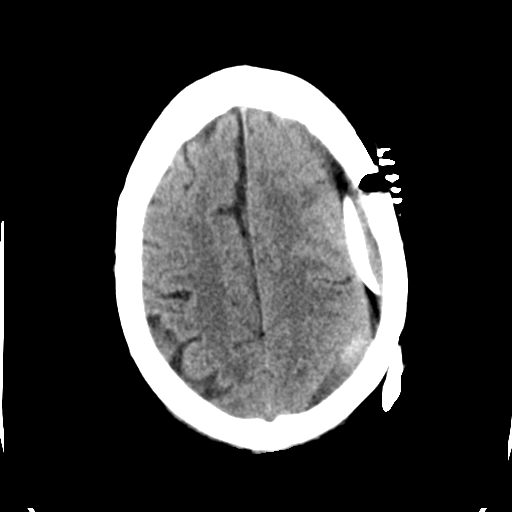
[im 28/34  brain]
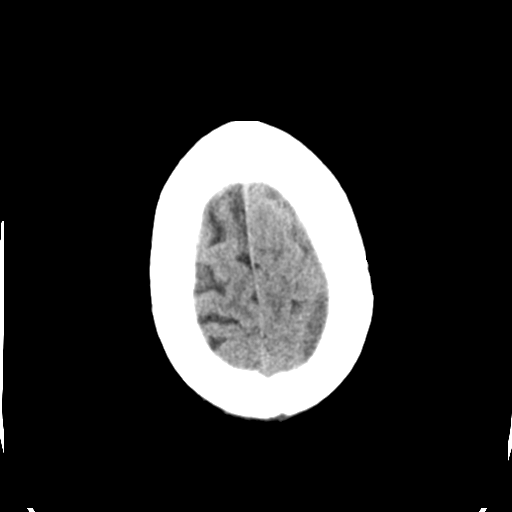
[im 31/34  brain]
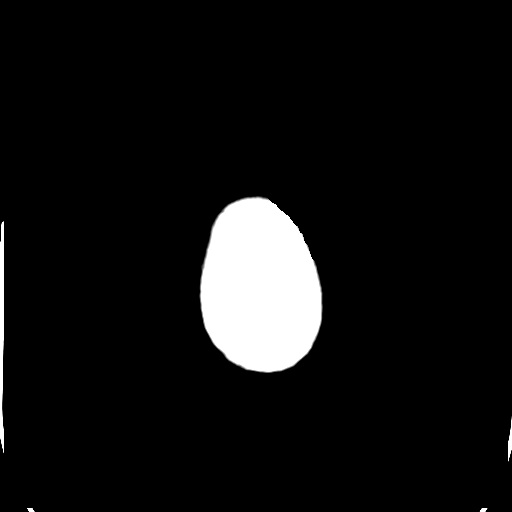
[im 31/34  bone]
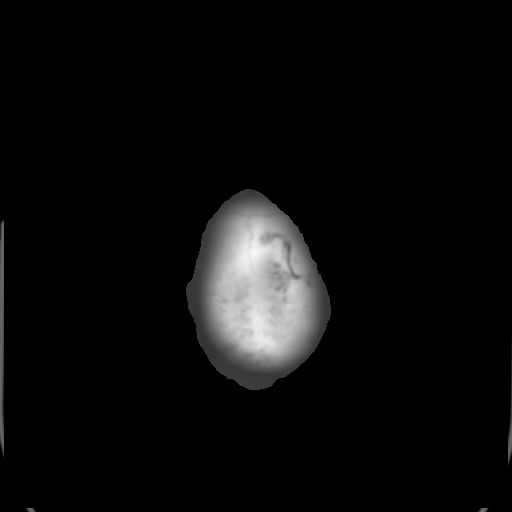

[Series 4: head 3.0 mpr · coronal · 0.34mm/px · 3 of 68 slices shown (1 of 2)]
[im 23/68  brain]
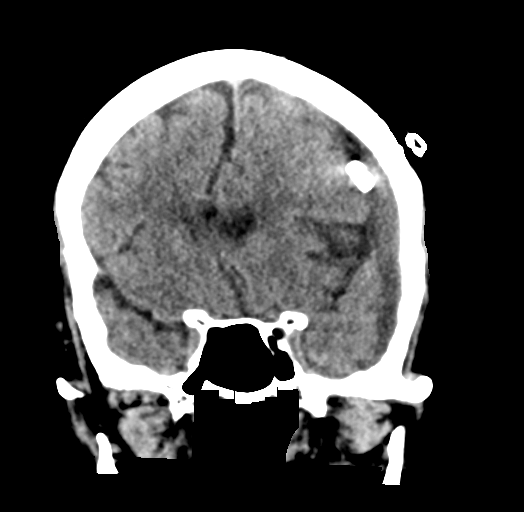
[im 30/68  brain]
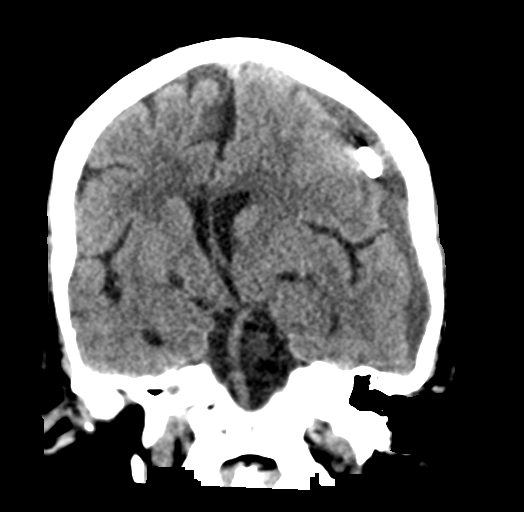
[im 38/68  brain]
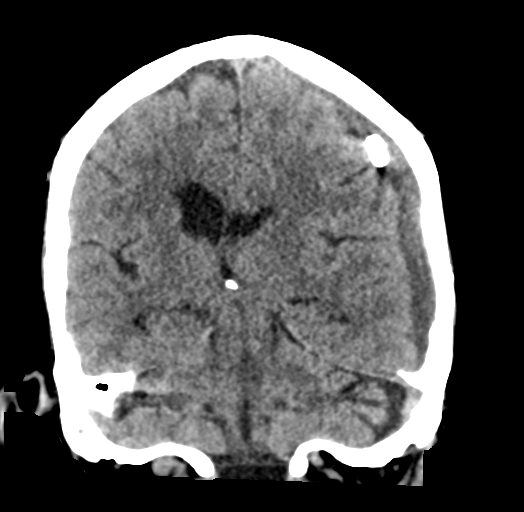

[Series 5: head 3.0 mpr · sagittal · 0.34mm/px · 3 of 64 slices shown (2 of 2)]
[im 22/64  brain]
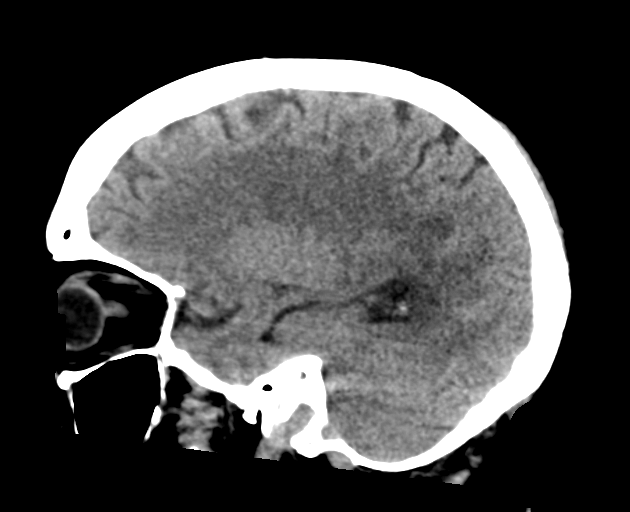
[im 32/64  brain]
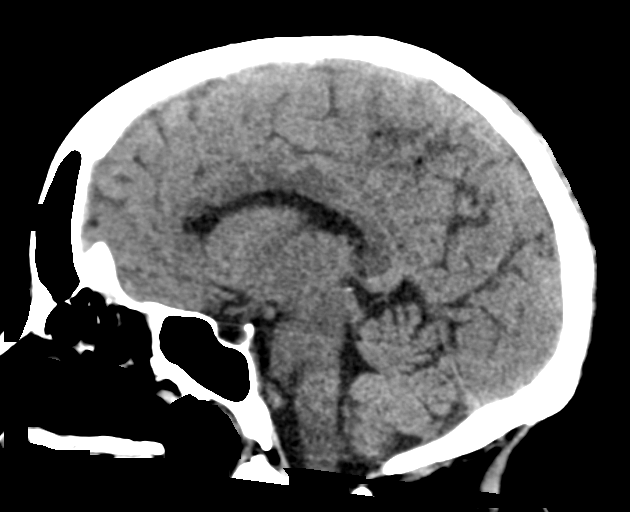
[im 43/64  brain]
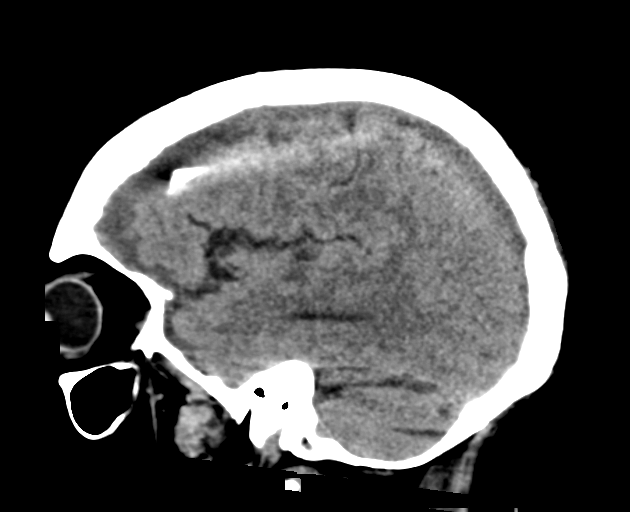

[15 of 47 positions shown; findings below may reference images not displayed]

FINDINGS: INTRACRANIAL CONTENTS: 9 mm residual LEFT holo hemispheric mixed
density subdural hematoma with interval placement of extra-axial
surgical drain. 10 mm residual LEFT-to-RIGHT midline shift, improved
from 17 mm. With partially re-expanded LEFT lateral ventricle, no
entrapment. No intraparenchymal hemorrhage or acute large vascular
territory infarct. Patchy supratentorial white matter hypodensities
compatible with chronic small vessel ischemic disease, most
conspicuous within genu of the corpus callosum. Basal cisterns are
patent.

ORBITS: The included ocular globes and orbital contents are
non-suspicious.

SINUSES: Mild ethmoid mucosal thickening without paranasal sinus
air-fluid levels. The mastoid air cells are well aerated.

SKULL/SOFT TISSUES: No skull fracture. Interval placement of LEFT
frontal and LEFT parietal burr holes with minimal overlying skin
staples.
IMPRESSION: 9 mm residual mixed density LEFT holo hemispheric subdural hematoma
with new LEFT extra-axial surgical drain in place. 10 mm residual
LEFT-to-RIGHT midline shift.

## 2018-04-02 DIAGNOSIS — Z7984 Long term (current) use of oral hypoglycemic drugs: Secondary | ICD-10-CM | POA: Diagnosis not present

## 2018-04-02 DIAGNOSIS — E119 Type 2 diabetes mellitus without complications: Secondary | ICD-10-CM | POA: Diagnosis not present

## 2018-04-02 DIAGNOSIS — Z961 Presence of intraocular lens: Secondary | ICD-10-CM | POA: Diagnosis not present

## 2018-04-02 DIAGNOSIS — H353222 Exudative age-related macular degeneration, left eye, with inactive choroidal neovascularization: Secondary | ICD-10-CM | POA: Diagnosis not present

## 2018-04-09 DIAGNOSIS — R0902 Hypoxemia: Secondary | ICD-10-CM | POA: Diagnosis not present

## 2018-04-23 DIAGNOSIS — Z23 Encounter for immunization: Secondary | ICD-10-CM | POA: Diagnosis not present

## 2018-04-23 DIAGNOSIS — M545 Low back pain: Secondary | ICD-10-CM | POA: Diagnosis not present

## 2018-04-23 DIAGNOSIS — M797 Fibromyalgia: Secondary | ICD-10-CM | POA: Diagnosis not present

## 2018-04-23 DIAGNOSIS — E039 Hypothyroidism, unspecified: Secondary | ICD-10-CM | POA: Diagnosis not present

## 2018-04-23 DIAGNOSIS — E1165 Type 2 diabetes mellitus with hyperglycemia: Secondary | ICD-10-CM | POA: Diagnosis not present

## 2018-05-10 DIAGNOSIS — R0902 Hypoxemia: Secondary | ICD-10-CM | POA: Diagnosis not present

## 2018-06-09 DIAGNOSIS — R0902 Hypoxemia: Secondary | ICD-10-CM | POA: Diagnosis not present

## 2018-07-05 DIAGNOSIS — H26491 Other secondary cataract, right eye: Secondary | ICD-10-CM | POA: Diagnosis not present

## 2018-07-05 DIAGNOSIS — H353223 Exudative age-related macular degeneration, left eye, with inactive scar: Secondary | ICD-10-CM | POA: Diagnosis not present

## 2018-07-05 DIAGNOSIS — H353112 Nonexudative age-related macular degeneration, right eye, intermediate dry stage: Secondary | ICD-10-CM | POA: Diagnosis not present

## 2018-07-05 DIAGNOSIS — H25812 Combined forms of age-related cataract, left eye: Secondary | ICD-10-CM | POA: Diagnosis not present

## 2018-07-10 DIAGNOSIS — R0902 Hypoxemia: Secondary | ICD-10-CM | POA: Diagnosis not present

## 2018-07-27 DIAGNOSIS — E039 Hypothyroidism, unspecified: Secondary | ICD-10-CM | POA: Diagnosis not present

## 2018-08-09 DIAGNOSIS — R0902 Hypoxemia: Secondary | ICD-10-CM | POA: Diagnosis not present

## 2018-09-09 DIAGNOSIS — R0902 Hypoxemia: Secondary | ICD-10-CM | POA: Diagnosis not present

## 2018-10-10 DIAGNOSIS — R0902 Hypoxemia: Secondary | ICD-10-CM | POA: Diagnosis not present

## 2018-11-08 DIAGNOSIS — R0902 Hypoxemia: Secondary | ICD-10-CM | POA: Diagnosis not present

## 2018-12-09 DIAGNOSIS — R0902 Hypoxemia: Secondary | ICD-10-CM | POA: Diagnosis not present

## 2019-01-08 DIAGNOSIS — R0902 Hypoxemia: Secondary | ICD-10-CM | POA: Diagnosis not present

## 2019-02-08 DIAGNOSIS — R0902 Hypoxemia: Secondary | ICD-10-CM | POA: Diagnosis not present

## 2019-03-05 DIAGNOSIS — F331 Major depressive disorder, recurrent, moderate: Secondary | ICD-10-CM | POA: Diagnosis not present

## 2019-03-05 DIAGNOSIS — I10 Essential (primary) hypertension: Secondary | ICD-10-CM | POA: Diagnosis not present

## 2019-03-05 DIAGNOSIS — E1165 Type 2 diabetes mellitus with hyperglycemia: Secondary | ICD-10-CM | POA: Diagnosis not present

## 2019-03-10 DIAGNOSIS — R0902 Hypoxemia: Secondary | ICD-10-CM | POA: Diagnosis not present

## 2019-04-10 DIAGNOSIS — R0902 Hypoxemia: Secondary | ICD-10-CM | POA: Diagnosis not present

## 2019-05-11 DIAGNOSIS — R0902 Hypoxemia: Secondary | ICD-10-CM | POA: Diagnosis not present

## 2019-06-10 DIAGNOSIS — R0902 Hypoxemia: Secondary | ICD-10-CM | POA: Diagnosis not present

## 2019-06-23 DIAGNOSIS — Z794 Long term (current) use of insulin: Secondary | ICD-10-CM | POA: Diagnosis not present

## 2019-06-23 DIAGNOSIS — W010XXA Fall on same level from slipping, tripping and stumbling without subsequent striking against object, initial encounter: Secondary | ICD-10-CM | POA: Diagnosis not present

## 2019-06-23 DIAGNOSIS — Z79899 Other long term (current) drug therapy: Secondary | ICD-10-CM | POA: Diagnosis not present

## 2019-06-23 DIAGNOSIS — E119 Type 2 diabetes mellitus without complications: Secondary | ICD-10-CM | POA: Diagnosis not present

## 2019-06-23 DIAGNOSIS — Z886 Allergy status to analgesic agent status: Secondary | ICD-10-CM | POA: Diagnosis not present

## 2019-06-23 DIAGNOSIS — S82832A Other fracture of upper and lower end of left fibula, initial encounter for closed fracture: Secondary | ICD-10-CM | POA: Diagnosis not present

## 2019-06-23 DIAGNOSIS — S93402A Sprain of unspecified ligament of left ankle, initial encounter: Secondary | ICD-10-CM | POA: Diagnosis not present

## 2019-06-23 DIAGNOSIS — S8252XA Displaced fracture of medial malleolus of left tibia, initial encounter for closed fracture: Secondary | ICD-10-CM | POA: Diagnosis not present

## 2019-06-23 DIAGNOSIS — F419 Anxiety disorder, unspecified: Secondary | ICD-10-CM | POA: Diagnosis not present

## 2019-06-23 DIAGNOSIS — E039 Hypothyroidism, unspecified: Secondary | ICD-10-CM | POA: Diagnosis not present

## 2019-06-23 DIAGNOSIS — R21 Rash and other nonspecific skin eruption: Secondary | ICD-10-CM | POA: Diagnosis not present

## 2019-06-23 DIAGNOSIS — Z87891 Personal history of nicotine dependence: Secondary | ICD-10-CM | POA: Diagnosis not present

## 2019-07-11 DIAGNOSIS — R0902 Hypoxemia: Secondary | ICD-10-CM | POA: Diagnosis not present

## 2019-08-05 DIAGNOSIS — I10 Essential (primary) hypertension: Secondary | ICD-10-CM | POA: Diagnosis not present

## 2019-08-05 DIAGNOSIS — E039 Hypothyroidism, unspecified: Secondary | ICD-10-CM | POA: Diagnosis not present

## 2019-08-10 DIAGNOSIS — R0902 Hypoxemia: Secondary | ICD-10-CM | POA: Diagnosis not present

## 2019-08-14 DIAGNOSIS — E039 Hypothyroidism, unspecified: Secondary | ICD-10-CM | POA: Diagnosis not present

## 2019-08-14 DIAGNOSIS — E1165 Type 2 diabetes mellitus with hyperglycemia: Secondary | ICD-10-CM | POA: Diagnosis not present

## 2019-08-14 DIAGNOSIS — Z23 Encounter for immunization: Secondary | ICD-10-CM | POA: Diagnosis not present

## 2019-08-14 DIAGNOSIS — E119 Type 2 diabetes mellitus without complications: Secondary | ICD-10-CM | POA: Diagnosis not present

## 2019-08-14 DIAGNOSIS — Z0001 Encounter for general adult medical examination with abnormal findings: Secondary | ICD-10-CM | POA: Diagnosis not present

## 2019-08-14 DIAGNOSIS — D519 Vitamin B12 deficiency anemia, unspecified: Secondary | ICD-10-CM | POA: Diagnosis not present

## 2019-08-14 DIAGNOSIS — Z6826 Body mass index (BMI) 26.0-26.9, adult: Secondary | ICD-10-CM | POA: Diagnosis not present

## 2019-08-14 DIAGNOSIS — I10 Essential (primary) hypertension: Secondary | ICD-10-CM | POA: Diagnosis not present

## 2019-08-14 DIAGNOSIS — L309 Dermatitis, unspecified: Secondary | ICD-10-CM | POA: Diagnosis not present

## 2019-08-14 DIAGNOSIS — M797 Fibromyalgia: Secondary | ICD-10-CM | POA: Diagnosis not present

## 2019-08-14 DIAGNOSIS — F339 Major depressive disorder, recurrent, unspecified: Secondary | ICD-10-CM | POA: Diagnosis not present

## 2019-08-14 DIAGNOSIS — M545 Low back pain: Secondary | ICD-10-CM | POA: Diagnosis not present

## 2019-09-05 DIAGNOSIS — E1165 Type 2 diabetes mellitus with hyperglycemia: Secondary | ICD-10-CM | POA: Diagnosis not present

## 2019-09-05 DIAGNOSIS — I10 Essential (primary) hypertension: Secondary | ICD-10-CM | POA: Diagnosis not present

## 2019-09-10 DIAGNOSIS — R0902 Hypoxemia: Secondary | ICD-10-CM | POA: Diagnosis not present

## 2019-10-07 DEATH — deceased
